# Patient Record
Sex: Male | Born: 1960 | Race: Black or African American | Hispanic: No | Smoking: Current every day smoker
Health system: Southern US, Community
[De-identification: ages and names within clinical notes are randomized; demographics above are authoritative.]

## PROBLEM LIST (undated history)

## (undated) DIAGNOSIS — I10 Essential (primary) hypertension: Secondary | ICD-10-CM

## (undated) HISTORY — PX: BRAIN SURGERY: SHX531

## (undated) NOTE — *Deleted (*Deleted)
PROGRESS NOTE  Gerald Morgan  DOB: [birth year]  PCP: [name], NP ZOX:[id number]  DOA: 03/05/2020  LOS: 0 days   Chief Complaint  Patient presents with  . Code Stroke   *** Brief narrative: CAP MASSI is a 65 y.o. 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 Patient was brought to ED from home by EMS on 11/19 as a code stroke for acute onset right-sided weakness and altered mental status. EMS noted his blood pressure to be severely elevated to 290.  Oxygen saturation 94%.  On arrival to the ED, blood pressure was 268/151, patient was densely hemiplegic on the right and was nonverbal. Stat CT head showed a massive acute left hemispheric bleed with the anteroinferior tip near the MCA.  There was intraventricular extension of blood and mass-effect with initial stasis with midline shift and enlarged ventricles. CTA head and neck negative for aneurysm. Patient was seen by neurology and neurosurgery. After discussion with consultants, family made a decision of comfort care.  Subjective: Patient was seen and examined *** Chart reviewed Blood pressure elevated to 196/121 this morning  Assessment/Plan: 21 year old male with acute left hemispheric ICH with mass effect, left to right midline shift and intraventricular extension -Currently on comfort care status per family's decision -As needed IV Ativan, IV morphine available. -DNR status.  Mobility: *** Code Status:   Code Status: DNR *** Nutritional status: Body mass index is 19.05 kg/m.     Diet Order            Diet NPO time specified  Diet effective now                *** DVT prophylaxis:    Antimicrobials: *** Fluid: *** Consultants: *** Family Communication: ***  Status is: Observation  {Observation:23811}  Dispo:  Patient From:  Home  Planned Disposition: Home  Expected discharge date: 03/06/20  Medically stable for discharge: No   Infusions:    Scheduled Meds: . sodium chloride   Intravenous Once  . sodium chloride flush  3 mL Intravenous Once    Antimicrobials: Anti-infectives (From admission, onward)   None      PRN meds: acetaminophen **OR** acetaminophen, hydrALAZINE, labetalol, LORazepam **OR** LORazepam **OR** LORazepam, morphine injection, morphine CONCENTRATE **OR** morphine CONCENTRATE   Objective: Vitals:   03/06/20 0610 03/06/20 0739  BP: (!) 158/98 (!) 196/121  Pulse: 83 89  Resp: 20   Temp: 98.6 F (37 C) 98.2 F (36.8 C)  SpO2: 99% 100%    Intake/Output Summary (Last 24 hours) at 03/06/2020 0742 Last data filed at 03/06/2020 0600 Gross per 24 hour  Intake 409.9 ml  Output 630 ml  Net -220.1 ml   Filed Weights   03/05/20 1732  Weight: 63.7 kg   Weight change:  Body mass index is 19.05 kg/m.   Physical Exam: General exam: *** Skin: No rashes, lesions or ulcers. HEENT: Atraumatic, normocephalic, no obvious bleeding Lungs: *** CVS: *** GI/Abd *** CNS: *** Psychiatry: *** Extremities: ***  Data Review: I have personally reviewed the laboratory data and studies available.  Recent Labs  Lab 03/05/20 1700 03/05/20 1701  WBC  --  13.0*  NEUTROABS  --  11.4*  HGB 17.3* 15.9  HCT 51.0 48.3  MCV  --  91.5  PLT  --  338   Recent Labs  Lab 03/05/20 1700 03/05/20  1701  NA 141 140  K 4.3 4.3  CL 107 106  CO2  --  21*  GLUCOSE 159* 160*  BUN 17 13  CREATININE 1.50* 1.68*  CALCIUM  --  9.5    F/u labs ***  Signed, Lorin Glass, MD [facility] 03/06/2020

---

## 2002-03-15 ENCOUNTER — Emergency Department (HOSPITAL_COMMUNITY): Admission: EM | Admit: 2002-03-15 | Discharge: 2002-03-15 | Payer: Self-pay | Admitting: Emergency Medicine

## 2005-10-02 ENCOUNTER — Emergency Department (HOSPITAL_COMMUNITY): Admission: EM | Admit: 2005-10-02 | Discharge: 2005-10-02 | Payer: Self-pay | Admitting: Emergency Medicine

## 2008-01-10 ENCOUNTER — Encounter (INDEPENDENT_AMBULATORY_CARE_PROVIDER_SITE_OTHER): Payer: Self-pay | Admitting: Surgery

## 2008-01-10 ENCOUNTER — Inpatient Hospital Stay (HOSPITAL_COMMUNITY): Admission: EM | Admit: 2008-01-10 | Discharge: 2008-01-13 | Payer: Self-pay | Admitting: Emergency Medicine

## 2010-08-30 NOTE — H&P (Signed)
NAMEDESHAWN, WITTY NO.:  [id number]   MEDICAL RECORD NO.:  [id number]          PATIENT TYPE:  EMS   LOCATION:  ED                           FACILITY:  [facility]   PHYSICIAN:  Sandria Bales. Ezzard Standing, M.D.  DATE OF BIRTH:  07-05-1960   DATE OF ADMISSION:  01/10/2008  DATE OF DISCHARGE:                              HISTORY & PHYSICAL   Date of admission ??   REASON FOR REFERRAL:  Appendicitis.   HISTORY OF ILLNESS:  This is a 50 year old black male with no primary  doctor, who has had abdominal pain starting Wednesday, January 08, 2008.   He did not feel sick enough yesterday but felt worse today and has  vomited and came to the emergency room for further evaluation.   He denies any history of peptic ulcer disease, liver disease, pancreatic  disease, or colon disease.  He has had no prior abdominal surgery.   He had a CT scan, which showed a retrocecal appendix with a diameter of  17 mm suggesting acute appendicitis.   PAST MEDICAL HISTORY:  He has no allergies.   He is on no medications.   REVIEW OF SYSTEMS:  NEUROLOGIC:  No seizure or loss of consciousness.  PULMONARY:  Smokes about a half a pack of cigarettes a day and knows  this is bad for his health.  CARDIAC:  He has no heart disease, chest pain, or hypertension.  GASTROINTESTINAL:  See History of Present Illness.  UROLOGIC:  No kidney stones or kidney infection.  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  MUSCULOSKELETAL:  He has had a fracture of his  left leg in the past.   Both his parents are in the room with him, he is unemployed but lives by  himself, worked Environmental manager 2 or 3 months ago.   PHYSICAL EXAMINATION:  VITAL SIGNS:  His temperature is 98.6, his pulse  is 87, respirations 18, blood pressure 144/105.  GENERAL:  He is a thin, otherwise well-nourished-looking, black male,  alert and cooperative on physical exam.  HEENT:  Unremarkable.  NECK:  Supple.  I felt no masses or thyromegaly.  LUNGS:  Clear to auscultation with symmetric breath sounds.  HEART:  Regular rate and rhythm without murmur or rub.  ABDOMEN:  It shows some mild distention.  He has some right-sided and  right flank tenderness, though it is not severe.  He does not have any  guarding.  PELVIS:  Stable.  EXTREMITIES:  He has good strength in the upper and lower extremities.  NEUROLOGICAL:  Grossly intact.   Labs that are part of the chart show a hemoglobin of 16, hematocrit of  48, white blood cell count of 19,400.  He has 80% neutrophils.  His  sodium is 137, potassium 3.8, chloride of 101, CO2 of 24, glucose of  116, creatinine of 1.1.   IMPRESSION:  1. Acute appendicitis.  Discussed with the patient and his family      about proceeding with appendectomy, to try to  do this      laparoscopically, there was a chance of open surgery.  The risks of      surgery includes bleeding, infection, which I think he already has,      and the possibility of an open surgery bowel resection.  2. Smokes cigarettes, knows this is bad for his health.  3. 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  4. Elevated BP - related to acute disease vs. chronic condition ??  5. Encouraged him to find a primary care physician that he can call      for problems and to follow up his blood pressure.      Sandria Bales. Ezzard Standing, M.D.  Electronically Signed     DHN/MEDQ  D:  01/10/2008  T:  01/10/2008  Job:  [id number]

## 2010-08-30 NOTE — Discharge Summary (Signed)
NAMEMARGUES, FILIPPINI NO.:  [id number]   MEDICAL RECORD NO.:  [id number]          PATIENT TYPE:  INP   LOCATION:  1338                         FACILITY:  [facility]   PHYSICIAN:  Sandria Bales. Ezzard Standing, M.D.  DATE OF BIRTH:  May 10, 1960   DATE OF ADMISSION:  01/10/2008  DATE OF DISCHARGE:  01/13/2008                               DISCHARGE SUMMARY   Date of discharge ??   DISCHARGE DIAGNOSES:  1. Appendicitis, retrocecal with abscess.  2. Borderline hypertension.  Needs primary care physician.   OPERATIONS PERFORMED:  On January 10, 2008, the patient had a  laparoscopic appendectomy.   HISTORY OF ILLNESS:  This is a 50 year old black male who has no primary  medical doctor, who had abdominal pain beginning January 08, 2008.  He  presented to the [facility] Emergency Room where he had a CT scan which  showed a retrocecal appendix with a diameter of the appendix being up to  17 mm suggesting acute appendicitis.   He was taken to the operating room and underwent a laparoscopic  appendectomy.  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   Postoperatively, he did well.  By the second postop day, he had vomited  after a milkshake.  He had some mild distention.  I checked a CBC on him  on the third day and his white blood count was 7500. his temperature was  98.5, and he was ready for discharge.  He had been given cefoxitin as an  antibiotic postoperatively.  He had also run some borderline  hypertension and I gave him Lopressor. His blood pressure, however, at  the time of discharge was back in the normal range.   DISCHARGE INSTRUCTIONS:  1. He can have a regular diet.  2. He can shower.  3. He should do no heavy lifting for about 15  pounds for about 1 week.  4. I  gave him Vicodin for pain.  I gave him Augmentin 875 mg for 4      more days for a total of 7 days of antibiotics.   I also encouraged him to get a primary medical doctor to make sure his  blood pressures are followed up and make sure he does not have chronic  hypertension.   DISCHARGE CONDITION:  Good.   PATHOLOGY REPORT:  Pending at time of dictation.      Sandria Bales. Ezzard Standing, M.D.  Electronically Signed     DHN/MEDQ  D:  01/13/2008  T:  01/13/2008  Job:  [id number]

## 2010-08-30 NOTE — Op Note (Signed)
Gerald Morgan, CHEESE NO.:  [id number]   MEDICAL RECORD NO.:  [id number]          PATIENT TYPE:  INP   LOCATION:  0098                         FACILITY:  [facility]   PHYSICIAN:  Sandria Bales. Ezzard Standing, M.D.  DATE OF BIRTH:  [birth year]   DATE OF PROCEDURE:  01/10/2008  DATE OF DISCHARGE:                               OPERATIVE REPORT   Date of surgery ??   PREOPERATIVE DIAGNOSIS:  Appendicitis.   POSTOPERATIVE DIAGNOSIS:  Focally-ruptured appendicitis with essentially  absent right colon.   PROCEDURE:  Laparoscopic appendectomy.   SURGEON:  Sandria Bales. Ezzard Standing, M.D.   FIRST ASSISTANT:  Anselm Pancoast. Zachery Dakins, M.D.   ANESTHESIA:  General endotracheal.   ESTIMATED BLOOD LOSS:  20 mL.   DRAINS LEFT IN:  None.   INDICATION FOR PROCEDURE:  Gerald Morgan is a 50 year old black male who has  had a 2-day history of right-sided abdominal pain and elevated white  blood count with CT scan suggesting a retrocecal appendicitis high in  the right posterior abdomen.   I discussed with the patient's family about proceeding with  appendectomy.  I discussed the indications and potential risks of  surgery including the risk of bleeding and need to resect bowel,  possibility of open surgery, and abnormal location of the appendix.   OPERATIVE NOTE:  The patient was placed in the supine position with his  left arm tucked and righr arm out to the side.  He was given a gram of  cefoxitin at the beginning of the surgical procedure, PAS stockings in  place, a Foley catheter in place.  He was prepped with Betadine solution  and sterilely draped.   I did a time-out, identifying the patient and procedure.   An infraumbilical incision was made with sharp dissection and carried  down to the abdominal cavity.  A 0-degrees 10-mm laparoscope was  inserted through a 12-mm Hasson trocar.  I placed 3 additional trocars,  a 12-mm in the right lower quadrant, a 5-mm in sort of the midepigastric  and a  5-mm in the right upper quadrant.  Exploration carried out  laparoscopically.  The right and left lobes of the liver were  unremarkable.  The stomach was unremarkable.  The gallbladder that I  could see was unremarkable.  He really had almost no right colon.  His  right colon had barely gone down below the liver.  He actually had a  small bowel, terminal ileum sort of fused along what would be a right  colonic gutter.   I had to mobilize what there was of the right colon actually up to  the  transverse colon.  I took the adhesions down in to the edge of the  liver, up to the edge of the gallbladder.   After mobilizing the cecum, I was able to get a purchase under the  appendix.  The appendix actually was dilated and focally ruptured with  an abscess, which I aspirated out, so there was purulence, which would  be a wound classification 4.   I took down the attachments of the appendix to the  cecum.  I took down  what I thought was the mesentery of the appendix with a vessel with the  Harmonic Scalpel.  I got to the base.  Because it was actually very  broad, I used an Endo GIA-45 stapler blue load, fired across.  I took  the entire appendix out and sent it to pathology.  Actually, Dr.  Zachery Dakins, who assisted with the operation, inspected the appendix on  the back table.   I then reinspected the cecum.  I could see no injury to the bowel or  cecum.  I reinspected the staple line.  I irrigated out the right  colonic gutter with about 2 L of saline.   The trocars were then removed in turn, the umbilical port closed with a  0 Vicryl suture, the skin closed with a 5-0 Monocryl suture, painted  with tincture of Benzoin and steri-stripped.  The sponge and needle  count were correct at the end of the case.   The patient tolerated the procedure well, was transported to the  recovery room in good condition.      Sandria Bales. Ezzard Standing, M.D.  Electronically Signed     DHN/MEDQ  D:   01/10/2008  T:  01/11/2008  Job:  [id number]

## 2011-01-16 LAB — DIFFERENTIAL
Basophils Absolute: 0
Basophils Absolute: 0
Basophils Relative: 0
Basophils Relative: 0
Lymphocytes Relative: 4 — ABNORMAL LOW
Monocytes Absolute: 0.9
Neutro Abs: 16.8 — ABNORMAL HIGH
Neutro Abs: 5.4
Neutrophils Relative %: 72
Neutrophils Relative %: 87 — ABNORMAL HIGH

## 2011-01-16 LAB — COMPREHENSIVE METABOLIC PANEL
Albumin: 4.2
Alkaline Phosphatase: 70
BUN: 7
Chloride: 101
Creatinine, Ser: 1.1
Glucose, Bld: 116 — ABNORMAL HIGH
Potassium: 3.8
Total Bilirubin: 1.2

## 2011-01-16 LAB — CBC
HCT: 48.8
Hemoglobin: 13.8
Hemoglobin: 16.4
MCHC: 32.9
MCV: 92.3
Platelets: 269
Platelets: 286
RDW: 13.4
RDW: 13.7
WBC: 19.4 — ABNORMAL HIGH

## 2011-01-16 LAB — URINALYSIS, ROUTINE W REFLEX MICROSCOPIC
Leukocytes, UA: NEGATIVE
Nitrite: NEGATIVE
Specific Gravity, Urine: 1.012
Urobilinogen, UA: 0.2
pH: 6

## 2011-01-16 LAB — URINE MICROSCOPIC-ADD ON

## 2011-09-19 ENCOUNTER — Emergency Department (HOSPITAL_COMMUNITY): Payer: Self-pay

## 2011-09-19 ENCOUNTER — Encounter (HOSPITAL_COMMUNITY): Payer: Self-pay | Admitting: Anesthesiology

## 2011-09-19 ENCOUNTER — Encounter (HOSPITAL_COMMUNITY)
Admission: EM | Disposition: A | Discharge status: Home / Self Care | Payer: Self-pay | Source: Home / Self Care | Attending: Neurological Surgery

## 2011-09-19 ENCOUNTER — Inpatient Hospital Stay (HOSPITAL_COMMUNITY)
Admission: EM | Admit: 2011-09-19 | Discharge: 2011-09-22 | DRG: 025 | Disposition: A | Discharge status: Home / Self Care | Payer: MEDICAID | Attending: Neurological Surgery | Admitting: Neurological Surgery

## 2011-09-19 ENCOUNTER — Inpatient Hospital Stay (HOSPITAL_COMMUNITY): Payer: Self-pay | Admitting: Anesthesiology

## 2011-09-19 ENCOUNTER — Encounter (HOSPITAL_COMMUNITY): Payer: Self-pay | Admitting: *Deleted

## 2011-09-19 DIAGNOSIS — S065X9A Traumatic subdural hemorrhage with loss of consciousness of unspecified duration, initial encounter: Secondary | ICD-10-CM

## 2011-09-19 DIAGNOSIS — Y92009 Unspecified place in unspecified non-institutional (private) residence as the place of occurrence of the external cause: Secondary | ICD-10-CM

## 2011-09-19 DIAGNOSIS — I6200 Nontraumatic subdural hemorrhage, unspecified: Secondary | ICD-10-CM | POA: Diagnosis present

## 2011-09-19 DIAGNOSIS — Y838 Other surgical procedures as the cause of abnormal reaction of the patient, or of later complication, without mention of misadventure at the time of the procedure: Secondary | ICD-10-CM | POA: Diagnosis present

## 2011-09-19 DIAGNOSIS — IMO0002 Reserved for concepts with insufficient information to code with codable children: Principal | ICD-10-CM | POA: Diagnosis present

## 2011-09-19 HISTORY — PX: CRANIOTOMY: SHX93

## 2011-09-19 HISTORY — DX: Essential (primary) hypertension: I10

## 2011-09-19 LAB — COMPREHENSIVE METABOLIC PANEL
AST: 40 U/L — ABNORMAL HIGH (ref 0–37)
Albumin: 3.4 g/dL — ABNORMAL LOW (ref 3.5–5.2)
Alkaline Phosphatase: 81 U/L (ref 39–117)
Chloride: 100 mEq/L (ref 96–112)
Potassium: 4 mEq/L (ref 3.5–5.1)
Total Bilirubin: 0.1 mg/dL — ABNORMAL LOW (ref 0.3–1.2)

## 2011-09-19 LAB — CBC
Hemoglobin: 13 g/dL (ref 13.0–17.0)
MCHC: 33.9 g/dL (ref 30.0–36.0)
RDW: 13.2 % (ref 11.5–15.5)
WBC: 10.6 10*3/uL — ABNORMAL HIGH (ref 4.0–10.5)

## 2011-09-19 LAB — DIFFERENTIAL
Basophils Absolute: 0 10*3/uL (ref 0.0–0.1)
Basophils Relative: 0 % (ref 0–1)
Lymphocytes Relative: 11 % — ABNORMAL LOW (ref 12–46)
Monocytes Relative: 10 % (ref 3–12)
Neutro Abs: 8.4 10*3/uL — ABNORMAL HIGH (ref 1.7–7.7)
Neutrophils Relative %: 79 % — ABNORMAL HIGH (ref 43–77)

## 2011-09-19 SURGERY — CRANIOTOMY HEMATOMA EVACUATION EPIDURAL
Anesthesia: General | Site: Head | Laterality: Left | Wound class: Clean

## 2011-09-19 MED ORDER — HEMOSTATIC AGENTS (NO CHARGE) OPTIME
TOPICAL | Status: DC | PRN
  Administered 2011-09-19: 1 via TOPICAL

## 2011-09-19 MED ORDER — GLYCOPYRROLATE 0.2 MG/ML IJ SOLN
INTRAMUSCULAR | Status: DC | PRN
  Administered 2011-09-19: 0.2 mg via INTRAVENOUS

## 2011-09-19 MED ORDER — LABETALOL HCL 5 MG/ML IV SOLN
5.0000 mg | INTRAVENOUS | Status: AC | PRN
  Administered 2011-09-19 (×4): 5 mg via INTRAVENOUS

## 2011-09-19 MED ORDER — SODIUM CHLORIDE 0.9 % IJ SOLN
3.0000 mL | Freq: Two times a day (BID) | INTRAMUSCULAR | Status: DC
  Administered 2011-09-19 – 2011-09-22 (×5): 3 mL via INTRAVENOUS

## 2011-09-19 MED ORDER — SODIUM CHLORIDE 0.9 % IV SOLN: 250.0000 mL | INTRAVENOUS | Status: DC

## 2011-09-19 MED ORDER — ONDANSETRON HCL 4 MG/2ML IJ SOLN
INTRAMUSCULAR | Status: DC | PRN
  Administered 2011-09-19: 4 mg via INTRAVENOUS

## 2011-09-19 MED ORDER — SODIUM CHLORIDE 0.9 % IV SOLN
INTRAVENOUS | Status: DC | PRN
  Administered 2011-09-19: 15:00:00 via INTRAVENOUS

## 2011-09-19 MED ORDER — ALUM & MAG HYDROXIDE-SIMETH 200-200-20 MG/5ML PO SUSP: 30.0000 mL | Freq: Four times a day (QID) | ORAL | Status: DC | PRN

## 2011-09-19 MED ORDER — ARTIFICIAL TEARS OP OINT
TOPICAL_OINTMENT | OPHTHALMIC | Status: DC | PRN
  Administered 2011-09-19: 1 via OPHTHALMIC

## 2011-09-19 MED ORDER — FENTANYL CITRATE 0.05 MG/ML IJ SOLN
25.0000 ug | INTRAMUSCULAR | Status: DC | PRN
  Administered 2011-09-21: 25 ug via INTRAVENOUS
  Filled 2011-09-19: qty 2

## 2011-09-19 MED ORDER — SODIUM CHLORIDE 0.9 % IV SOLN
INTRAVENOUS | Status: AC
  Filled 2011-09-19: qty 500

## 2011-09-19 MED ORDER — ROCURONIUM BROMIDE 100 MG/10ML IV SOLN
INTRAVENOUS | Status: DC | PRN
  Administered 2011-09-19: 50 mg via INTRAVENOUS

## 2011-09-19 MED ORDER — ONDANSETRON HCL 4 MG/2ML IJ SOLN: 4.0000 mg | Freq: Four times a day (QID) | INTRAMUSCULAR | Status: DC | PRN

## 2011-09-19 MED ORDER — SUCCINYLCHOLINE CHLORIDE 20 MG/ML IJ SOLN
INTRAMUSCULAR | Status: DC | PRN
  Administered 2011-09-19: 100 mg via INTRAVENOUS

## 2011-09-19 MED ORDER — ACETAMINOPHEN 325 MG PO TABS
650.0000 mg | ORAL_TABLET | ORAL | Status: DC | PRN
  Administered 2011-09-21 – 2011-09-22 (×4): 650 mg via ORAL
  Filled 2011-09-19 (×4): qty 2

## 2011-09-19 MED ORDER — ACETAMINOPHEN 650 MG RE SUPP: 650.0000 mg | RECTAL | Status: DC | PRN

## 2011-09-19 MED ORDER — LABETALOL HCL 5 MG/ML IV SOLN
INTRAVENOUS | Status: AC
  Administered 2011-09-19: 5 mg via INTRAVENOUS
  Filled 2011-09-19: qty 4

## 2011-09-19 MED ORDER — CEFAZOLIN SODIUM-DEXTROSE 2-3 GM-% IV SOLR
INTRAVENOUS | Status: AC
  Administered 2011-09-19: 2 g via INTRAVENOUS
  Filled 2011-09-19: qty 50

## 2011-09-19 MED ORDER — SODIUM CHLORIDE 0.9 % IR SOLN
Status: DC | PRN
  Administered 2011-09-19: 18:00:00

## 2011-09-19 MED ORDER — BACITRACIN 50000 UNITS IM SOLR
INTRAMUSCULAR | Status: AC
  Filled 2011-09-19: qty 1

## 2011-09-19 MED ORDER — PHENOL 1.4 % MT LIQD: 1.0000 | OROMUCOSAL | Status: DC | PRN

## 2011-09-19 MED ORDER — MENTHOL 3 MG MT LOZG: 1.0000 | LOZENGE | OROMUCOSAL | Status: DC | PRN

## 2011-09-19 MED ORDER — THIAMINE HCL 100 MG/ML IJ SOLN
Freq: Once | INTRAVENOUS | Status: DC
  Filled 2011-09-19: qty 1000

## 2011-09-19 MED ORDER — THROMBIN 20000 UNITS EX KIT
PACK | CUTANEOUS | Status: DC | PRN
  Administered 2011-09-19: 18:00:00 via TOPICAL

## 2011-09-19 MED ORDER — LISINOPRIL 10 MG PO TABS
10.0000 mg | ORAL_TABLET | Freq: Every day | ORAL | Status: DC
  Administered 2011-09-19 – 2011-09-22 (×4): 10 mg via ORAL
  Filled 2011-09-19 (×4): qty 1

## 2011-09-19 MED ORDER — 0.9 % SODIUM CHLORIDE (POUR BTL) OPTIME
TOPICAL | Status: DC | PRN
  Administered 2011-09-19 (×2): 1000 mL

## 2011-09-19 MED ORDER — NEOSTIGMINE METHYLSULFATE 1 MG/ML IJ SOLN
INTRAMUSCULAR | Status: DC | PRN
  Administered 2011-09-19: 3 mg via INTRAVENOUS

## 2011-09-19 MED ORDER — MORPHINE SULFATE 2 MG/ML IJ SOLN
1.0000 mg | INTRAMUSCULAR | Status: DC | PRN
  Administered 2011-09-20: 4 mg via INTRAVENOUS
  Administered 2011-09-20: 2 mg via INTRAVENOUS
  Administered 2011-09-20: 4 mg via INTRAVENOUS
  Administered 2011-09-22: 2 mg via INTRAVENOUS
  Filled 2011-09-19: qty 1
  Filled 2011-09-19 (×2): qty 2
  Filled 2011-09-19: qty 1

## 2011-09-19 MED ORDER — LABETALOL HCL 5 MG/ML IV SOLN
INTRAVENOUS | Status: DC | PRN
  Administered 2011-09-19: 5 mg via INTRAVENOUS
  Administered 2011-09-19: 10 mg via INTRAVENOUS
  Administered 2011-09-19 (×3): 5 mg via INTRAVENOUS

## 2011-09-19 MED ORDER — ONDANSETRON HCL 4 MG/2ML IJ SOLN: 4.0000 mg | INTRAMUSCULAR | Status: DC | PRN

## 2011-09-19 MED ORDER — LIDOCAINE HCL (CARDIAC) 20 MG/ML IV SOLN
INTRAVENOUS | Status: DC | PRN
  Administered 2011-09-19: 80 mg via INTRAVENOUS
  Administered 2011-09-19: 50 mg via INTRAVENOUS

## 2011-09-19 MED ORDER — PNEUMOCOCCAL VAC POLYVALENT 25 MCG/0.5ML IJ INJ: 0.5000 mL | INJECTION | INTRAMUSCULAR | Status: DC

## 2011-09-19 MED ORDER — CEFAZOLIN SODIUM 1-5 GM-% IV SOLN
1.0000 g | Freq: Three times a day (TID) | INTRAVENOUS | Status: AC
  Administered 2011-09-19 – 2011-09-20 (×2): 1 g via INTRAVENOUS
  Filled 2011-09-19 (×2): qty 50

## 2011-09-19 MED ORDER — SODIUM CHLORIDE 0.9 % IJ SOLN: 3.0000 mL | INTRAMUSCULAR | Status: DC | PRN

## 2011-09-19 MED ORDER — FENTANYL CITRATE 0.05 MG/ML IJ SOLN
INTRAMUSCULAR | Status: DC | PRN
  Administered 2011-09-19 (×5): 50 ug via INTRAVENOUS

## 2011-09-19 MED ORDER — PROPOFOL 10 MG/ML IV EMUL
INTRAVENOUS | Status: DC | PRN
  Administered 2011-09-19: 180 mg via INTRAVENOUS

## 2011-09-19 SURGICAL SUPPLY — 73 items
0.5% BUPIVACAINE 30ML IMPLANT
1% XYLOCAINE W/EPI 1:100,000 20ML IMPLANT
BAG DECANTER FOR FLEXI CONT (MISCELLANEOUS) ×3 IMPLANT
BANDAGE GAUZE ELAST BULKY 4 IN (GAUZE/BANDAGES/DRESSINGS) ×3 IMPLANT
BIT DRILL WIRE PASS 1.3MM (BIT) IMPLANT
BRUSH SCRUB EZ PLAIN DRY (MISCELLANEOUS) ×3 IMPLANT
BUR ACORN 6.0 (BURR) IMPLANT
BUR ADDG 1.1 (BURR) IMPLANT
BUR ROUTER D-58 CRANI (BURR) ×3 IMPLANT
CANISTER SUCTION 2500CC (MISCELLANEOUS) ×6 IMPLANT
CLIP TI MEDIUM 6 (CLIP) IMPLANT
CLOTH BEACON ORANGE TIMEOUT ST (SAFETY) ×3 IMPLANT
CONT SPEC 4OZ CLIKSEAL STRL BL (MISCELLANEOUS) ×3 IMPLANT
CORDS BIPOLAR (ELECTRODE) ×3 IMPLANT
DECANTER SPIKE VIAL GLASS SM (MISCELLANEOUS) IMPLANT
DRAIN CHANNEL 10M FLAT 3/4 FLT (DRAIN) IMPLANT
DRAIN JACKSON PRATT 10MM FLAT (MISCELLANEOUS) ×3 IMPLANT
DRAIN PENROSE 1/2X12 LTX STRL (WOUND CARE) IMPLANT
DRAPE SURG IRRIG POUCH 19X23 (DRAPES) ×3 IMPLANT
DRAPE WARM FLUID 44X44 (DRAPE) ×3 IMPLANT
DRILL WIRE PASS 1.3MM (BIT)
DRSG ADAPTIC 3X8 NADH LF (GAUZE/BANDAGES/DRESSINGS) IMPLANT
DRSG PAD ABDOMINAL 8X10 ST (GAUZE/BANDAGES/DRESSINGS) IMPLANT
DURAPREP 6ML APPLICATOR 50/CS (WOUND CARE) IMPLANT
ELECT CAUTERY BLADE 6.4 (BLADE) IMPLANT
ELECT REM PT RETURN 9FT ADLT (ELECTROSURGICAL) ×3
ELECTRODE REM PT RTRN 9FT ADLT (ELECTROSURGICAL) ×2 IMPLANT
EVACUATOR SILICONE 100CC (DRAIN) ×3 IMPLANT
GAUZE SPONGE 4X4 16PLY XRAY LF (GAUZE/BANDAGES/DRESSINGS) IMPLANT
GLOVE BIO SURGEON STRL SZ7.5 (GLOVE) IMPLANT
GLOVE BIOGEL PI IND STRL 7.5 (GLOVE) ×2 IMPLANT
GLOVE BIOGEL PI IND STRL 8.5 (GLOVE) ×2 IMPLANT
GLOVE BIOGEL PI INDICATOR 7.5 (GLOVE) ×1
GLOVE BIOGEL PI INDICATOR 8.5 (GLOVE) ×1
GLOVE ECLIPSE 7.5 STRL STRAW (GLOVE) ×6 IMPLANT
GLOVE ECLIPSE 8.5 STRL (GLOVE) ×3 IMPLANT
GLOVE EXAM NITRILE LRG STRL (GLOVE) IMPLANT
GLOVE EXAM NITRILE MD LF STRL (GLOVE) ×6 IMPLANT
GLOVE EXAM NITRILE XL STR (GLOVE) IMPLANT
GLOVE EXAM NITRILE XS STR PU (GLOVE) IMPLANT
GOWN BRE IMP SLV AUR LG STRL (GOWN DISPOSABLE) IMPLANT
GOWN BRE IMP SLV AUR XL STRL (GOWN DISPOSABLE) ×3 IMPLANT
GOWN STRL REIN 2XL LVL4 (GOWN DISPOSABLE) ×3 IMPLANT
HEMOSTAT SURGICEL 2X14 (HEMOSTASIS) ×3 IMPLANT
HOOK DURA (MISCELLANEOUS) ×3 IMPLANT
KIT BASIN OR (CUSTOM PROCEDURE TRAY) ×3 IMPLANT
KIT ROOM TURNOVER OR (KITS) ×3 IMPLANT
NEEDLE HYPO 22GX1.5 SAFETY (NEEDLE) ×3 IMPLANT
NS IRRIG 1000ML POUR BTL (IV SOLUTION) ×6 IMPLANT
PACK CRANIOTOMY (CUSTOM PROCEDURE TRAY) ×3 IMPLANT
PATTIES SURGICAL .5 X.5 (GAUZE/BANDAGES/DRESSINGS) IMPLANT
PATTIES SURGICAL .5 X3 (DISPOSABLE) IMPLANT
PATTIES SURGICAL 1X1 (DISPOSABLE) IMPLANT
PIN MAYFIELD SKULL DISP (PIN) IMPLANT
PLATE 1.5  2HOLE MED NEURO (Plate) ×3 IMPLANT
PLATE 1.5 2HOLE MED NEURO (Plate) ×6 IMPLANT
SCREW SELF DRILL HT 1.5/4MM (Screw) ×21 IMPLANT
SPECIMEN JAR SMALL (MISCELLANEOUS) IMPLANT
SPONGE GAUZE 4X4 12PLY (GAUZE/BANDAGES/DRESSINGS) ×3 IMPLANT
SPONGE NEURO XRAY DETECT 1X3 (DISPOSABLE) IMPLANT
SPONGE SURGIFOAM ABS GEL 100 (HEMOSTASIS) ×3 IMPLANT
STAPLER SKIN PROX WIDE 3.9 (STAPLE) ×3 IMPLANT
SUT ETHILON 3 0 FSL (SUTURE) ×3 IMPLANT
SUT NURALON 4 0 TR CR/8 (SUTURE) ×6 IMPLANT
SUT VIC AB 2-0 CP2 18 (SUTURE) ×6 IMPLANT
SYR 20ML ECCENTRIC (SYRINGE) ×3 IMPLANT
SYR CONTROL 10ML LL (SYRINGE) ×3 IMPLANT
TOWEL OR 17X24 6PK STRL BLUE (TOWEL DISPOSABLE) IMPLANT
TOWEL OR 17X26 10 PK STRL BLUE (TOWEL DISPOSABLE) ×6 IMPLANT
TRAP SPECIMEN MUCOUS 40CC (MISCELLANEOUS) IMPLANT
TRAY FOLEY CATH 14FRSI W/METER (CATHETERS) ×3 IMPLANT
UNDERPAD 30X30 INCONTINENT (UNDERPADS AND DIAPERS) IMPLANT
WATER STERILE IRR 1000ML POUR (IV SOLUTION) ×3 IMPLANT

## 2011-09-19 NOTE — Op Note (Signed)
Preoperative diagnosis: Recurrent subdural hematoma left frontal parietal Postoperative diagnosis: Postoperative epidural hematoma left frontal parietal Procedure: Evacuation of epidural hematoma via left frontal craniotomy Surgeon: Barnett Abu M.D. Anesthesia: Gen. endotracheal Indications: The patient is a 51 year old right-handed individual who had a subdural hematoma little over a week ago. This was evacuated at [facility] in [place]. He was discharged home. Over the past 4 days he's had an increasing subgaleal fluid collection. He's also begun to feel weak and today he came to the emergency room with weakness in the right side and a CT scan demonstrates the presence of a left frontal collection believed to be a subdural hematoma.  Procedure: Patient was brought to the operating room supine on a stretcher. After the smooth induction of general endotracheal anesthesia his head was turned to the right side on a doughnut head holder and the scalp was prepped with Betadine scrub and solution. The staples were removed from his previously made incision. After draping sterilely the skin was opened with Metzenbaum scissors releasing the subgaleal sutures. A subgaleal hematoma was encountered and this was evacuated. The craniotomy flap was identified and a small titanium plates and screws were identified also. Unfortunately our screwdriver did not fit the cranial plates and screws and therefore the plates were destroyed in an effort to remove the bone flap. Once the flap was removed immediately apparent was a large epidural hematoma. Evacuation of this hematoma yielded a soft pulsatile dura. The area was inspected carefully. There was noted to be a number of tack ups in the periphery of the craniotomy. No central tack ups were noted. After checking carefully for any bleeding points none of an arterial nature were identified. The dura then had several central tack ups placed through the craniotomy  plate and new simple 2 hole plates were used around the periphery for a total of 4 plates to hold up the craniotomy flap area the central tack ups were tied down area the scalp was then reflected into its original position and a 10 mm Jackson-Pratt drain was placed into the subgaleal space. The galea was closed with 2-0 Vicryl in inverted interrupted fashion. The skin was closed with surgical staples. Blood loss for the procedure was less than 100 cc. The patient tolerated the procedure well.

## 2011-09-19 NOTE — Anesthesia Postprocedure Evaluation (Signed)
  Anesthesia Post-op Note  Patient: Gerald Morgan  Procedure(s) Performed: Procedure(s) (LRB): CRANIOTOMY HEMATOMA EVACUATION EPIDURAL (Left)  Patient Location: PACU  Anesthesia Type: General  Level of Consciousness: awake  Airway and Oxygen Therapy: Patient Spontanous Breathing  Post-op Pain: mild  Post-op Assessment: Post-op Vital signs reviewed  Post-op Vital Signs: Reviewed  Complications: No apparent anesthesia complications

## 2011-09-19 NOTE — Progress Notes (Signed)
UR complete 

## 2011-09-19 NOTE — ED Provider Notes (Signed)
History     CSN: [id number]  Arrival date & time 09/19/11  0903   None     Chief Complaint  Patient presents with  . Dizziness  . Fatigue    (Consider location/radiation/quality/duration/timing/severity/associated sxs/prior treatment) Patient is a 51 y.o. male presenting with weakness. The history is provided by the patient and a parent. No language interpreter was used.  Weakness The primary symptoms include headaches, altered mental status, dizziness and speech change. The symptoms began more than 1 week ago. The symptoms are worsening. The neurological symptoms are diffuse. The symptoms occurred following head trauma.  The headache is associated with weakness and loss of balance. The headache is not associated with photophobia.  Dizziness also occurs with weakness.  Additional symptoms include weakness and loss of balance. Additional symptoms do not include photophobia. Medical issues do not include seizures. Workup history includes CT scan.  Pt fell and hit his head a week ago while in [facility] on concrete.  Pt had emergency surgery at [facility] due to "blood clot"   Pt reports this am he feels dizzy and groggy.    No past medical history on file.  No past surgical history on file.  No family history on file.  History  Substance Use Topics  . Smoking status: Not on file  . Smokeless tobacco: Not on file  . Alcohol Use: Not on file      Review of Systems  Eyes: Negative for photophobia.  Neurological: Positive for dizziness, speech change, weakness, headaches and loss of balance.  Psychiatric/Behavioral: Positive for altered mental status.  All other systems reviewed and are negative.    Allergies  Review of patient's allergies indicates no known allergies.  Home Medications   Current Outpatient Rx  Name Route Sig Dispense Refill  . BUTALBITAL-APAP-CAFFEINE 50-325-40 MG PO TABS Oral Take 1 tablet by mouth every 6 (six) hours as needed. For headache    .  LISINOPRIL 10 MG PO TABS Oral Take 10 mg by mouth daily.      BP 136/94  Pulse 73  Temp(Src) 98.5 F (36.9 C) (Oral)  Resp 16  SpO2 100%  Physical Exam  Nursing note and vitals reviewed. Constitutional: He appears well-developed and well-nourished.  HENT:  Head: Normocephalic and atraumatic.  Right Ear: External ear normal.  Left Ear: External ear normal.  Nose: Nose normal.  Mouth/Throat: Oropharynx is clear and moist.  Eyes: Conjunctivae and EOM are normal. Pupils are equal, round, and reactive to light.  Neck: Normal range of motion. Neck supple.  Cardiovascular: Normal rate.   Pulmonary/Chest: Effort normal.  Abdominal: Soft.  Musculoskeletal: Normal range of motion.  Neurological: He is alert. He has normal reflexes. A cranial nerve deficit is present.       Alert, speech seems slow,  Left face head swollen,  Question facial droop vs swelling  Skin: Skin is warm.  Psychiatric: He has a normal mood and affect.    ED Course  Procedures (including critical care time)  Labs Reviewed  CBC - Abnormal; Notable for the following:    WBC 10.6 (*)    RBC 4.12 (*)    HCT 38.4 (*)    Platelets 500 (*)    All other components within normal limits  DIFFERENTIAL - Abnormal; Notable for the following:    Neutrophils Relative 79 (*)    Neutro Abs 8.4 (*)    Lymphocytes Relative 11 (*)    All other components within normal limits  COMPREHENSIVE  METABOLIC PANEL - Abnormal; Notable for the following:    Glucose, Bld 120 (*)    Albumin 3.4 (*)    AST 40 (*)    Total Bilirubin 0.1 (*)    All other components within normal limits   No results found.   1. Subdural hematoma       MDM  Records ordered from [facility]    Date: 09/19/2011  Rate: 63  Rhythm: normal sinus rhythm  QRS Axis: normal  Intervals: normal  ST/T Wave abnormalities: normal  Conduction Disutrbances:none  Narrative Interpretation:   Old EKG Reviewed: none available   Results for orders  placed during the hospital encounter of 09/19/11  CBC      Component Value Range   WBC 10.6 (*) 4.0 - 10.5 (K/uL)   RBC 4.12 (*) 4.22 - 5.81 (MIL/uL)   Hemoglobin 13.0  13.0 - 17.0 (g/dL)   HCT 40.9 (*) 81.1 - 52.0 (%)   MCV 93.2  78.0 - 100.0 (fL)   MCH 31.6  26.0 - 34.0 (pg)   MCHC 33.9  30.0 - 36.0 (g/dL)   RDW 91.4  78.2 - 95.6 (%)   Platelets 500 (*) 150 - 400 (K/uL)  DIFFERENTIAL      Component Value Range   Neutrophils Relative 79 (*) 43 - 77 (%)   Neutro Abs 8.4 (*) 1.7 - 7.7 (K/uL)   Lymphocytes Relative 11 (*) 12 - 46 (%)   Lymphs Abs 1.2  0.7 - 4.0 (K/uL)   Monocytes Relative 10  3 - 12 (%)   Monocytes Absolute 1.0  0.1 - 1.0 (K/uL)   Eosinophils Relative 0  0 - 5 (%)   Eosinophils Absolute 0.0  0.0 - 0.7 (K/uL)   Basophils Relative 0  0 - 1 (%)   Basophils Absolute 0.0  0.0 - 0.1 (K/uL)   Ct Head Wo Contrast  09/19/2011  *RADIOLOGY REPORT*  Clinical Data: Head trauma 1 week ago  CT HEAD WITHOUT CONTRAST  Technique:  Contiguous axial images were obtained from the base of the skull through the vertex without contrast.  Comparison: None.  Findings: There is subdural hematoma in the left frontoparietal lobe measures 1.9 cm thickness by 10.4 cm length.  There is area with high density material posteriorly within hematoma  measures three by 1.4 cm suspicious for active or subacute bleeding.  I cannot exclude  some combination of epidural hematoma due to mass effect on the left hemisphere.  There is at least 9 mm left to right midline shift.  There is significant mass effect on the left anterior and left posterior ventricles.  Small amount of air there is small amount of pneumocephalus anterior aspect of the hematoma.  There is a large scalp hematoma with subcutaneous air and air bubbles within hematoma in the left frontoparietal scalp measures at least 10 x 2.3 cm.  There is probable prior craniotomy in the left frontoparietal lobe.  In axial image 13 there is a intraparenchymal  hematoma in the left frontal lobe measures 1.8 cm. Small amount of blood is noted along the interhemispheric fissure.  No definite acute infarct.  No intraventricular hemorrhage.  IMPRESSION: There is subdural hematoma in the left frontoparietal lobe measures 1.9 cm thickness by 10.4 cm length.  There is area with high density material posteriorly within hematoma  measures three by 1.4 cm suspicious for active or subacute bleeding.  I cannot exclude some combination of epidural hematoma due to mass effect on the left  hemisphere.  There is at least 9 mm left to right midline shift.  There is significant mass effect on the left anterior and left posterior ventricles.  Small amount of air there is small amount of pneumocephalus anterior aspect of the  hematoma.  There is a large scalp hematoma with subcutaneous air and air bubbles within hematoma in the left frontoparietal scalp measures at least 10 x 2.3 cm.  There is probable prior craniotomy in the left frontoparietal lobe.  In axial image 13 there is a intraparenchymal hematoma in the left frontal lobe measures 1.8 cm. Small amount of blood is noted along the interhemispheric fissure.  Critical findings discussed with Dr.Caparossi  Original Report Authenticated By: Natasha Mead, M.D.   [facility] in to see and examine pt.   Call to Neurosurgeon for evaluation   Elson Areas, [place] 09/19/11 1125  Lonia Skinner [place], [place] 09/19/11 1127  Lonia Skinner [place], [place] 09/19/11 1128

## 2011-09-19 NOTE — ED Notes (Signed)
Pt states he woke up this am with dizziness. States he is feeling more tired than usual. Pt had recent head trauma and was admitted to hospital for 4 days, staples still in place. Stroke scale negative. Denies headache at this time.
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
## 2011-09-19 NOTE — Consult Note (Signed)
Is called by Dr. Manfred Arch to review a CT scan on [name]. By history the patient had a craniotomy a little over week ago for a large left frontoparietal subdural hematoma. This was done in [facility] in [place]. He was discharged a few days later and returned [facility] where he's noted a progressively enlarging subgaleal fluid collection. He's become increasingly ataxic. Presented to the emergency room today CT scan demonstrates the presence of a large recurrent subdural hematoma and left frontal and parietal regions. This will require re\re evacuation.  On physical examination at the current time the patient is awake alert oriented there is some mild to moderate right facial droop. There is a mild right upper extremity drift but his lower extremities appear intact.  For other details of the patient's past medical history social history systems review we see a formal history and physical dictated separately.

## 2011-09-19 NOTE — ED Provider Notes (Signed)
Medical screening examination/treatment/procedure(s) were conducted as a shared visit with non-physician practitioner(s) and myself.  I personally evaluated the patient during the encounter  Cheri Guppy, MD 09/19/11 1534

## 2011-09-19 NOTE — Transfer of Care (Signed)
Immediate Anesthesia Transfer of Care Note  Patient: Gerald Morgan  Procedure(s) Performed: Procedure(s) (LRB): CRANIOTOMY HEMATOMA EVACUATION EPIDURAL (Left)  Patient Location: PACU  Anesthesia Type: General  Level of Consciousness: sedated and patient cooperative  Airway & Oxygen Therapy: Patient Spontanous Breathing and Patient connected to nasal cannula oxygen  Post-op Assessment: Report given to PACU RN, Post -op Vital signs reviewed and stable and Patient moving all extremities X 4  Post vital signs: Reviewed and stable  Complications: No apparent anesthesia complications
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
## 2011-09-19 NOTE — ED Notes (Signed)
Floor RN returned called. Not ready for PT at this time. Will call back when ready for Pt to go to the floor.
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
## 2011-09-19 NOTE — ED Notes (Signed)
Floor called. Attempted to give report.

## 2011-09-20 ENCOUNTER — Encounter (HOSPITAL_COMMUNITY): Payer: Self-pay | Admitting: Neurological Surgery

## 2011-09-20 MED ORDER — CEFAZOLIN SODIUM 1-5 GM-% IV SOLN
1.0000 g | Freq: Three times a day (TID) | INTRAVENOUS | Status: DC
  Administered 2011-09-20 – 2011-09-22 (×6): 1 g via INTRAVENOUS
  Filled 2011-09-20 (×8): qty 50

## 2011-09-20 NOTE — Progress Notes (Signed)
Occupational Therapy Note  OT order received and appreciated.  Pt able to demonstrate BADLs and functional transfers at mod I level.  Pt reports that he will have necessary level of assist upon d/c home.  No DME needs.  Pt reports that he has no questions or concerns about returning home.  At this time, pt has no acute OT needs.  Please re-order if pt experiences decline in functional status. Thanks!  09/20/2011 Cipriano Mile OTR/L Pager [phone] Office [phone]

## 2011-09-20 NOTE — Progress Notes (Signed)
Patient ID: Gerald Morgan, male   DOB: 08-Apr-1961, 51 y.o.   MRN: [id number] Alert oriented feels much better than yesterday. No headache minimal scalp pain from surgery.  Examination reveals no evidence of cortical drift drain with moderate output 100 cc overnight. Facial asymmetry markedly improved also.  Plan discontinue arterial line Foley catheter is already out. Ambulate patient. Leave subgaleal drain in place. Observe in intensive care unit today.
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
## 2011-09-21 NOTE — Progress Notes (Signed)
Chaplain Note:  Chaplain visited with pt and pt's family.  Pt was resting in bed, awake, oriented, and in good spirits.  Family was seated at bedside.  Chaplain provided spiritual comfort and support for pt and pt's family.  Both expressed appreciation for chaplain support.  Chaplain will follow up as needed.  09/21/11 1400  Clinical Encounter Type  Visited With Patient  Visit Type Spiritual support  Referral From Other (Comment) (Rasul Decola-referral)  Spiritual Encounters  Spiritual Needs Emotional  Stress Factors  Patient Stress Factors Health changes  Family Stress Factors None identified   Verdie Shire, chaplain resident [phone]

## 2011-09-21 NOTE — Progress Notes (Signed)
Clinical Social Worker received referral from MD for SNF.  However, PT/OT are recommending home.  CSW not to sign on.  Please re consult if additional needs arise.   Angelia Mould, MSW, [place] [phone]

## 2011-09-21 NOTE — Progress Notes (Signed)
Pt not ambulated this morning at scheduled time due to visitors in room with patient.

## 2011-09-21 NOTE — Progress Notes (Signed)
Patient transferred to unit 3000. Room 3606 bed 1, report called and given to [facility], Charity fundraiser. All belongings with patient upon transfer, patient stable upon transfer.

## 2011-09-22 NOTE — Discharge Summary (Signed)
Pt given d/c instructions along with f/u apt to be made with Dr. Danielle Dess in 10 days for staple removal.  Pt and parents verbalized understanding of d/c instructions.  Pt d/c'd home via w/c accompanied by medical staff and family.

## 2011-09-22 NOTE — Discharge Summary (Signed)
  Admitting diagnosis: Postoperative recurrent intracranial hematoma (subdural) Discharge and final diagnosis: Postoperative epidural hematoma left frontal parietal region Major operation: Left frontal craniotomy for evacuation of postoperative hematoma (epidural) Condition on discharge: Improved Hospital course: Patient was admitted from the emergency department having had a craniotomy for a subdural hematoma approximately 12 days ago in [facility] at [facility]. The patient developed progressively worsening headache and weakness on his right side. He was noted to be ataxic. A CT scan performed at in the emergency department demonstrated the presence of a large epidural hematoma. He was taken to the operating room or in her went repeat craniotomy in the same area and the hematoma was evacuated.  Postoperatively the patient has done well he denies any significant problems with pain he is ambulatory and his strength has recovered on the right side. A subgaleal drain that was placed at the time of surgery is removed. His incision is clean and dry. He's been advised as to postoperative activities. He'll be seen in little over a week's time for staple removal. No prescriptions are written.
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
## 2011-09-29 ENCOUNTER — Other Ambulatory Visit (HOSPITAL_COMMUNITY): Payer: Self-pay | Admitting: Neurological Surgery

## 2011-09-29 DIAGNOSIS — S065X9A Traumatic subdural hemorrhage with loss of consciousness of unspecified duration, initial encounter: Secondary | ICD-10-CM

## 2011-10-23 ENCOUNTER — Other Ambulatory Visit (HOSPITAL_COMMUNITY): Payer: Self-pay

## 2011-10-27 ENCOUNTER — Other Ambulatory Visit (HOSPITAL_COMMUNITY): Payer: Self-pay

## 2011-11-06 ENCOUNTER — Ambulatory Visit (HOSPITAL_COMMUNITY)
Admission: RE | Admit: 2011-11-06 | Discharge: 2011-11-06 | Disposition: A | Discharge status: Home / Self Care | Payer: Self-pay | Source: Ambulatory Visit | Attending: Neurological Surgery | Admitting: Neurological Surgery

## 2011-11-06 DIAGNOSIS — R51 Headache: Secondary | ICD-10-CM | POA: Insufficient documentation

## 2011-11-06 DIAGNOSIS — S065X9A Traumatic subdural hemorrhage with loss of consciousness of unspecified duration, initial encounter: Secondary | ICD-10-CM

## 2011-12-20 ENCOUNTER — Emergency Department (HOSPITAL_COMMUNITY)
Admission: EM | Admit: 2011-12-20 | Discharge: 2011-12-20 | Disposition: A | Discharge status: Home / Self Care | Payer: Self-pay | Attending: Emergency Medicine | Admitting: Emergency Medicine

## 2011-12-20 ENCOUNTER — Encounter (HOSPITAL_COMMUNITY): Payer: Self-pay | Admitting: Emergency Medicine

## 2011-12-20 DIAGNOSIS — F17200 Nicotine dependence, unspecified, uncomplicated: Secondary | ICD-10-CM | POA: Insufficient documentation

## 2011-12-20 DIAGNOSIS — R1013 Epigastric pain: Secondary | ICD-10-CM | POA: Insufficient documentation

## 2011-12-20 DIAGNOSIS — I10 Essential (primary) hypertension: Secondary | ICD-10-CM | POA: Insufficient documentation

## 2011-12-20 DIAGNOSIS — R112 Nausea with vomiting, unspecified: Secondary | ICD-10-CM | POA: Insufficient documentation

## 2011-12-20 DIAGNOSIS — K529 Noninfective gastroenteritis and colitis, unspecified: Secondary | ICD-10-CM

## 2011-12-20 LAB — CBC WITH DIFFERENTIAL/PLATELET
Basophils Absolute: 0 10*3/uL (ref 0.0–0.1)
Eosinophils Absolute: 0.2 10*3/uL (ref 0.0–0.7)
Eosinophils Relative: 2 % (ref 0–5)
MCH: 31.5 pg (ref 26.0–34.0)
MCHC: 34.7 g/dL (ref 30.0–36.0)
MCV: 90.8 fL (ref 78.0–100.0)
Platelets: 271 10*3/uL (ref 150–400)
RDW: 14.4 % (ref 11.5–15.5)
WBC: 9 10*3/uL (ref 4.0–10.5)

## 2011-12-20 LAB — URINALYSIS, ROUTINE W REFLEX MICROSCOPIC
Glucose, UA: NEGATIVE mg/dL
Ketones, ur: 40 mg/dL — AB
Leukocytes, UA: NEGATIVE
Protein, ur: 100 mg/dL — AB

## 2011-12-20 LAB — HEPATIC FUNCTION PANEL
ALT: 14 U/L (ref 0–53)
AST: 21 U/L (ref 0–37)
Bilirubin, Direct: 0.1 mg/dL (ref 0.0–0.3)
Total Bilirubin: 0.5 mg/dL (ref 0.3–1.2)

## 2011-12-20 LAB — URINE MICROSCOPIC-ADD ON

## 2011-12-20 LAB — BASIC METABOLIC PANEL
CO2: 27 mEq/L (ref 19–32)
Calcium: 9.6 mg/dL (ref 8.4–10.5)
Creatinine, Ser: 0.83 mg/dL (ref 0.50–1.35)
Glucose, Bld: 116 mg/dL — ABNORMAL HIGH (ref 70–99)

## 2011-12-20 MED ORDER — SODIUM CHLORIDE 0.9 % IV BOLUS (SEPSIS)
1000.0000 mL | Freq: Once | INTRAVENOUS | Status: AC
  Administered 2011-12-20: 1000 mL via INTRAVENOUS

## 2011-12-20 MED ORDER — HYDROCODONE-ACETAMINOPHEN 5-500 MG PO TABS: 1.0000 | ORAL_TABLET | Freq: Four times a day (QID) | ORAL | Status: AC | PRN

## 2011-12-20 MED ORDER — MORPHINE SULFATE 4 MG/ML IJ SOLN
4.0000 mg | Freq: Once | INTRAMUSCULAR | Status: AC
  Administered 2011-12-20: 4 mg via INTRAVENOUS
  Filled 2011-12-20: qty 1

## 2011-12-20 MED ORDER — ONDANSETRON HCL 4 MG/2ML IJ SOLN
4.0000 mg | Freq: Once | INTRAMUSCULAR | Status: AC
  Administered 2011-12-20: 4 mg via INTRAVENOUS
  Filled 2011-12-20: qty 2

## 2011-12-20 MED ORDER — PROMETHAZINE HCL 25 MG PO TABS: 25.0000 mg | ORAL_TABLET | Freq: Four times a day (QID) | ORAL | Status: DC | PRN

## 2011-12-20 NOTE — ED Notes (Signed)
Pt. Reports abdominal pain x2 days with N/V. Abdomin tender to palpitation. Denies diarrhea or fever.

## 2011-12-20 NOTE — ED Notes (Signed)
Prescriptions x2 given with discharge instructions.  

## 2011-12-20 NOTE — ED Notes (Signed)
PT. REPORTS MID ABDOMINAL PAIN WITH VOMITTING  FOR 2 DAYS , DENIES DIARRHEA  , FEVER OR CHILLS.

## 2011-12-20 NOTE — ED Provider Notes (Signed)
History     CSN: [id number]  Arrival date & time 12/20/11  0028   First MD Initiated Contact with Patient 12/20/11 [phone]      Chief Complaint  Patient presents with  . Abdominal Pain    (Consider location/radiation/quality/duration/timing/severity/associated sxs/prior treatment) HPI Comments: Patient presents with two day history of worsening upper abd pain, nausea, and vomiting.  He denies fevers or chills.  There are no ill contacts.  All emesis is non-bloody.  Patient is a 51 y.o. male presenting with abdominal pain. The history is provided by the patient.  Abdominal Pain The primary symptoms of the illness include abdominal pain, fatigue, nausea and vomiting. The primary symptoms of the illness do not include fever, diarrhea or dysuria. The current episode started 2 days ago. The onset of the illness was gradual. The problem has been gradually worsening.  The patient has not had a change in bowel habit. Symptoms associated with the illness do not include chills.    Past Medical History  Diagnosis Date  . Hypertension     Past Surgical History  Procedure Date  . Brain surgery   . Craniotomy 09/19/2011    Procedure: CRANIOTOMY HEMATOMA EVACUATION EPIDURAL;  Surgeon: Barnett Abu, MD;  Location: [facility];  Service: Neurosurgery;  Laterality: Left;  Redo craniotomy for evacuation of epidural hematoma    No family history on file.  History  Substance Use Topics  . Smoking status: Current Everyday Smoker -- 0.5 packs/day    Types: Cigarettes  . Smokeless tobacco: Never Used  . Alcohol Use: Yes      Review of Systems  Constitutional: Positive for fatigue. Negative for fever and chills.  Gastrointestinal: Positive for nausea, vomiting and abdominal pain. Negative for diarrhea.  Genitourinary: Negative for dysuria.  All other systems reviewed and are negative.    Allergies  Review of patient's allergies indicates no known allergies.  Home Medications   Current  Outpatient Rx  Name Route Sig Dispense Refill  . ACETAMINOPHEN 500 MG PO TABS Oral Take 500 mg by mouth every 6 (six) hours as needed. For pain    . BUTALBITAL-APAP-CAFFEINE 50-325-40 MG PO TABS Oral Take 1 tablet by mouth every 6 (six) hours as needed. For headache    . LISINOPRIL 10 MG PO TABS Oral Take 10 mg by mouth daily.      BP 188/147  Pulse 83  Temp 98.1 F (36.7 C)  Resp 18  SpO2 98%  Physical Exam  Nursing note and vitals reviewed. Constitutional: He is oriented to person, place, and time. He appears well-developed and well-nourished. No distress.  HENT:  Head: Normocephalic and atraumatic.  Mouth/Throat: Oropharynx is clear and moist.  Neck: Normal range of motion. Neck supple.  Cardiovascular: Normal rate and regular rhythm.   No murmur heard. Pulmonary/Chest: Effort normal and breath sounds normal. No respiratory distress. He has no wheezes.  Abdominal: Soft. Bowel sounds are normal. He exhibits no distension.       There is mild ttp in the epigastrium without rebound or guarding.    Musculoskeletal: Normal range of motion. He exhibits no edema.  Lymphadenopathy:    He has no cervical adenopathy.  Neurological: He is alert and oriented to person, place, and time.  Skin: Skin is warm and dry. He is not diaphoretic.    ED Course  Procedures (including critical care time)  Labs Reviewed  URINALYSIS, ROUTINE W REFLEX MICROSCOPIC - Abnormal; Notable for the following:    Color, Urine  AMBER (*)  BIOCHEMICALS MAY BE AFFECTED BY COLOR   Hgb urine dipstick TRACE (*)     Bilirubin Urine MODERATE (*)     Ketones, ur 40 (*)     Protein, ur 100 (*)     All other components within normal limits  BASIC METABOLIC PANEL - Abnormal; Notable for the following:    Potassium 3.4 (*)     Glucose, Bld 116 (*)     All other components within normal limits  CBC WITH DIFFERENTIAL - Abnormal; Notable for the following:    Hemoglobin 17.5 (*)     All other components within normal  limits  URINE MICROSCOPIC-ADD ON   No results found.   No diagnosis found.    MDM  The patient presents with the complaints of abd, nausea, and vomiting.  Fluids were given and labs were obtained.  These were essentially unremarkable.  There was no evidence for pancreatitis or elevation of lfts.  The wbc was within normal range and the patient was afebrile.  The exam was benign and I believe these symptoms are likely viral in nature.  He was hydrated and medicated and is feeling better.  He will be discharged to home, to return prn for high fever, bloody emesis, or if he experiences further problems.          Geoffery Lyons, MD 12/21/11 [phone]

## 2014-04-17 ENCOUNTER — Encounter (HOSPITAL_COMMUNITY)
Admission: EM | Disposition: A | Discharge status: Home / Self Care | Payer: Self-pay | Source: Home / Self Care | Attending: Neurosurgery

## 2014-04-17 ENCOUNTER — Inpatient Hospital Stay (HOSPITAL_COMMUNITY): Payer: Self-pay | Admitting: Anesthesiology

## 2014-04-17 ENCOUNTER — Inpatient Hospital Stay (HOSPITAL_COMMUNITY)
Admission: EM | Admit: 2014-04-17 | Discharge: 2014-04-22 | DRG: 027 | Disposition: A | Discharge status: Home / Self Care | Payer: Self-pay | Attending: Neurosurgery | Admitting: Neurosurgery

## 2014-04-17 ENCOUNTER — Emergency Department (HOSPITAL_COMMUNITY): Payer: Self-pay

## 2014-04-17 ENCOUNTER — Encounter (HOSPITAL_COMMUNITY): Payer: Self-pay | Admitting: Physical Medicine and Rehabilitation

## 2014-04-17 DIAGNOSIS — S065X0A Traumatic subdural hemorrhage without loss of consciousness, initial encounter: Principal | ICD-10-CM | POA: Diagnosis present

## 2014-04-17 DIAGNOSIS — S02402A Zygomatic fracture, unspecified, initial encounter for closed fracture: Secondary | ICD-10-CM | POA: Diagnosis present

## 2014-04-17 DIAGNOSIS — S0993XA Unspecified injury of face, initial encounter: Secondary | ICD-10-CM | POA: Diagnosis present

## 2014-04-17 DIAGNOSIS — I10 Essential (primary) hypertension: Secondary | ICD-10-CM | POA: Diagnosis present

## 2014-04-17 DIAGNOSIS — S028XXA Fractures of other specified skull and facial bones, initial encounter for closed fracture: Secondary | ICD-10-CM | POA: Diagnosis present

## 2014-04-17 DIAGNOSIS — S064X9A Epidural hemorrhage with loss of consciousness of unspecified duration, initial encounter: Secondary | ICD-10-CM | POA: Diagnosis present

## 2014-04-17 DIAGNOSIS — S0990XA Unspecified injury of head, initial encounter: Secondary | ICD-10-CM

## 2014-04-17 DIAGNOSIS — F1010 Alcohol abuse, uncomplicated: Secondary | ICD-10-CM | POA: Diagnosis present

## 2014-04-17 DIAGNOSIS — F17210 Nicotine dependence, cigarettes, uncomplicated: Secondary | ICD-10-CM | POA: Diagnosis present

## 2014-04-17 DIAGNOSIS — S065X9A Traumatic subdural hemorrhage with loss of consciousness of unspecified duration, initial encounter: Secondary | ICD-10-CM

## 2014-04-17 DIAGNOSIS — S02401A Maxillary fracture, unspecified, initial encounter for closed fracture: Secondary | ICD-10-CM | POA: Diagnosis present

## 2014-04-17 DIAGNOSIS — S065XAA Traumatic subdural hemorrhage with loss of consciousness status unknown, initial encounter: Secondary | ICD-10-CM | POA: Diagnosis present

## 2014-04-17 HISTORY — PX: CRANIOTOMY: SHX93

## 2014-04-17 LAB — PROTIME-INR
INR: 1.06 (ref 0.00–1.49)
PROTHROMBIN TIME: 13.9 s (ref 11.6–15.2)

## 2014-04-17 LAB — I-STAT CHEM 8, ED
BUN: 8 mg/dL (ref 6–23)
CHLORIDE: 99 meq/L (ref 96–112)
CREATININE: 0.7 mg/dL (ref 0.50–1.35)
Calcium, Ion: 1.03 mmol/L — ABNORMAL LOW (ref 1.12–1.23)
Glucose, Bld: 104 mg/dL — ABNORMAL HIGH (ref 70–99)
HCT: 54 % — ABNORMAL HIGH (ref 39.0–52.0)
Hemoglobin: 18.4 g/dL — ABNORMAL HIGH (ref 13.0–17.0)
POTASSIUM: 4.2 mmol/L (ref 3.5–5.1)
SODIUM: 139 mmol/L (ref 135–145)
TCO2: 21 mmol/L (ref 0–100)

## 2014-04-17 LAB — CBC WITH DIFFERENTIAL/PLATELET
BASOS PCT: 0 % (ref 0–1)
Basophils Absolute: 0 10*3/uL (ref 0.0–0.1)
EOS ABS: 0 10*3/uL (ref 0.0–0.7)
Eosinophils Relative: 0 % (ref 0–5)
HCT: 48 % (ref 39.0–52.0)
HEMOGLOBIN: 16.3 g/dL (ref 13.0–17.0)
Lymphocytes Relative: 8 % — ABNORMAL LOW (ref 12–46)
Lymphs Abs: 0.8 10*3/uL (ref 0.7–4.0)
MCH: 31.9 pg (ref 26.0–34.0)
MCHC: 34 g/dL (ref 30.0–36.0)
MCV: 93.9 fL (ref 78.0–100.0)
MONO ABS: 0.6 10*3/uL (ref 0.1–1.0)
MONOS PCT: 6 % (ref 3–12)
NEUTROS PCT: 86 % — AB (ref 43–77)
Neutro Abs: 9.1 10*3/uL — ABNORMAL HIGH (ref 1.7–7.7)
Platelets: 275 10*3/uL (ref 150–400)
RBC: 5.11 MIL/uL (ref 4.22–5.81)
RDW: 14 % (ref 11.5–15.5)
WBC: 10.4 10*3/uL (ref 4.0–10.5)

## 2014-04-17 LAB — BASIC METABOLIC PANEL
ANION GAP: 16 — AB (ref 5–15)
BUN: 6 mg/dL (ref 6–23)
CALCIUM: 8.8 mg/dL (ref 8.4–10.5)
CHLORIDE: 100 meq/L (ref 96–112)
CO2: 23 mmol/L (ref 19–32)
Creatinine, Ser: 0.72 mg/dL (ref 0.50–1.35)
GFR calc Af Amer: >90 mL/min (ref >90)
GFR calc non Af Amer: >90 mL/min (ref >90)
GLUCOSE: 108 mg/dL — AB (ref 70–99)
Potassium: 4.2 mmol/L (ref 3.5–5.1)
SODIUM: 139 mmol/L (ref 135–145)

## 2014-04-17 LAB — APTT: aPTT: 29 seconds (ref 24–37)

## 2014-04-17 LAB — I-STAT CG4 LACTIC ACID, ED: LACTIC ACID, VENOUS: 6.27 mmol/L — AB (ref 0.5–2.2)

## 2014-04-17 LAB — ETHANOL: Alcohol, Ethyl (B): 104 mg/dL — ABNORMAL HIGH (ref 0–9)

## 2014-04-17 SURGERY — CRANIOTOMY HEMATOMA EVACUATION SUBDURAL
Anesthesia: General | Site: Head | Laterality: Left

## 2014-04-17 MED ORDER — ESMOLOL HCL 10 MG/ML IV SOLN
INTRAVENOUS | Status: AC
  Filled 2014-04-17: qty 10

## 2014-04-17 MED ORDER — MANNITOL 25 % IV SOLN
INTRAVENOUS | Status: AC
  Filled 2014-04-17: qty 100

## 2014-04-17 MED ORDER — ADULT MULTIVITAMIN W/MINERALS CH
1.0000 | ORAL_TABLET | Freq: Every day | ORAL | Status: DC
  Administered 2014-04-18 – 2014-04-22 (×5): 1 via ORAL
  Filled 2014-04-17 (×7): qty 1

## 2014-04-17 MED ORDER — FENTANYL CITRATE 0.05 MG/ML IJ SOLN
INTRAMUSCULAR | Status: AC
  Filled 2014-04-17: qty 5

## 2014-04-17 MED ORDER — LIDOCAINE HCL (CARDIAC) 20 MG/ML IV SOLN
INTRAVENOUS | Status: AC
  Filled 2014-04-17: qty 5

## 2014-04-17 MED ORDER — MIDAZOLAM HCL 2 MG/2ML IJ SOLN
INTRAMUSCULAR | Status: AC
  Filled 2014-04-17: qty 2

## 2014-04-17 MED ORDER — LABETALOL HCL 5 MG/ML IV SOLN
10.0000 mg | INTRAVENOUS | Status: DC | PRN
  Administered 2014-04-19 (×2): 10 mg via INTRAVENOUS
  Administered 2014-04-20: 20 mg via INTRAVENOUS
  Filled 2014-04-17: qty 8

## 2014-04-17 MED ORDER — HYDRALAZINE HCL 20 MG/ML IJ SOLN
INTRAMUSCULAR | Status: DC | PRN
  Administered 2014-04-17: 5 mg via INTRAVENOUS

## 2014-04-17 MED ORDER — ONDANSETRON HCL 4 MG/2ML IJ SOLN
4.0000 mg | Freq: Once | INTRAMUSCULAR | Status: AC
  Administered 2014-04-17: 4 mg via INTRAVENOUS
  Filled 2014-04-17: qty 2

## 2014-04-17 MED ORDER — VITAMIN B-1 100 MG PO TABS
100.0000 mg | ORAL_TABLET | Freq: Every day | ORAL | Status: DC
  Administered 2014-04-18 – 2014-04-22 (×5): 100 mg via ORAL
  Filled 2014-04-17 (×7): qty 1

## 2014-04-17 MED ORDER — FOLIC ACID 1 MG PO TABS
1.0000 mg | ORAL_TABLET | Freq: Every day | ORAL | Status: DC
  Administered 2014-04-18 – 2014-04-22 (×5): 1 mg via ORAL
  Filled 2014-04-17 (×7): qty 1

## 2014-04-17 MED ORDER — GLYCOPYRROLATE 0.2 MG/ML IJ SOLN
INTRAMUSCULAR | Status: DC | PRN
  Administered 2014-04-17: 0.6 mg via INTRAVENOUS

## 2014-04-17 MED ORDER — SODIUM CHLORIDE 0.9 % IV SOLN
INTRAVENOUS | Status: DC | PRN
  Administered 2014-04-17: 18:00:00 via INTRAVENOUS

## 2014-04-17 MED ORDER — DOCUSATE SODIUM 100 MG PO CAPS
100.0000 mg | ORAL_CAPSULE | Freq: Two times a day (BID) | ORAL | Status: DC
  Administered 2014-04-18 – 2014-04-22 (×8): 100 mg via ORAL
  Filled 2014-04-17 (×13): qty 1

## 2014-04-17 MED ORDER — ACETAMINOPHEN 325 MG PO TABS
650.0000 mg | ORAL_TABLET | ORAL | Status: DC | PRN
  Administered 2014-04-19 – 2014-04-21 (×4): 650 mg via ORAL
  Filled 2014-04-17 (×4): qty 2

## 2014-04-17 MED ORDER — NICARDIPINE HCL IN NACL 20-0.86 MG/200ML-% IV SOLN
INTRAVENOUS | Status: DC | PRN
  Administered 2014-04-17: 5 mg/h via INTRAVENOUS

## 2014-04-17 MED ORDER — LEVETIRACETAM IN NACL 500 MG/100ML IV SOLN
500.0000 mg | Freq: Two times a day (BID) | INTRAVENOUS | Status: DC
  Administered 2014-04-17 – 2014-04-22 (×10): 500 mg via INTRAVENOUS
  Filled 2014-04-17 (×14): qty 100

## 2014-04-17 MED ORDER — LORAZEPAM 1 MG PO TABS
1.0000 mg | ORAL_TABLET | Freq: Four times a day (QID) | ORAL | Status: AC | PRN
  Administered 2014-04-19: 1 mg via ORAL
  Filled 2014-04-17: qty 1

## 2014-04-17 MED ORDER — BACITRACIN ZINC 500 UNIT/GM EX OINT
TOPICAL_OINTMENT | CUTANEOUS | Status: DC | PRN
  Administered 2014-04-17: 1 via TOPICAL

## 2014-04-17 MED ORDER — LIDOCAINE HCL (CARDIAC) 20 MG/ML IV SOLN
INTRAVENOUS | Status: DC | PRN
  Administered 2014-04-17: 50 mg via INTRAVENOUS

## 2014-04-17 MED ORDER — LABETALOL HCL 5 MG/ML IV SOLN
INTRAVENOUS | Status: DC | PRN
  Administered 2014-04-17 (×3): 5 mg via INTRAVENOUS

## 2014-04-17 MED ORDER — ARTIFICIAL TEARS OP OINT
TOPICAL_OINTMENT | OPHTHALMIC | Status: DC | PRN
  Administered 2014-04-17: 1 via OPHTHALMIC

## 2014-04-17 MED ORDER — THIAMINE HCL 100 MG/ML IJ SOLN
100.0000 mg | Freq: Every day | INTRAMUSCULAR | Status: DC
  Administered 2014-04-17: 100 mg via INTRAVENOUS
  Filled 2014-04-17 (×3): qty 1

## 2014-04-17 MED ORDER — ESMOLOL HCL 10 MG/ML IV SOLN
INTRAVENOUS | Status: DC | PRN
  Administered 2014-04-17 (×2): 20 ug via INTRAVENOUS

## 2014-04-17 MED ORDER — POTASSIUM CHLORIDE IN NACL 20-0.9 MEQ/L-% IV SOLN
INTRAVENOUS | Status: DC
  Administered 2014-04-17 – 2014-04-19 (×3): 75 mL/h via INTRAVENOUS
  Administered 2014-04-19: 03:00:00 via INTRAVENOUS
  Filled 2014-04-17 (×6): qty 1000

## 2014-04-17 MED ORDER — ROCURONIUM BROMIDE 100 MG/10ML IV SOLN
INTRAVENOUS | Status: DC | PRN
  Administered 2014-04-17: 50 mg via INTRAVENOUS

## 2014-04-17 MED ORDER — HYDRALAZINE HCL 20 MG/ML IJ SOLN
INTRAMUSCULAR | Status: AC
  Filled 2014-04-17: qty 1

## 2014-04-17 MED ORDER — MICROFIBRILLAR COLL HEMOSTAT EX POWD
CUTANEOUS | Status: DC | PRN
  Administered 2014-04-17: 1 g via TOPICAL

## 2014-04-17 MED ORDER — SODIUM CHLORIDE 0.9 % IR SOLN
Status: DC | PRN
  Administered 2014-04-17: 500 mL

## 2014-04-17 MED ORDER — ONDANSETRON HCL 4 MG/2ML IJ SOLN: 4.0000 mg | INTRAMUSCULAR | Status: DC | PRN

## 2014-04-17 MED ORDER — MICROFIBRILLAR COLL HEMOSTAT EX PADS
MEDICATED_PAD | CUTANEOUS | Status: DC | PRN
  Administered 2014-04-17: 1 via TOPICAL

## 2014-04-17 MED ORDER — SUCCINYLCHOLINE CHLORIDE 20 MG/ML IJ SOLN
INTRAMUSCULAR | Status: DC | PRN
  Administered 2014-04-17: 100 mg via INTRAVENOUS

## 2014-04-17 MED ORDER — THROMBIN 20000 UNITS EX SOLR
CUTANEOUS | Status: DC | PRN
  Administered 2014-04-17: 20:00:00 via TOPICAL

## 2014-04-17 MED ORDER — LISINOPRIL 10 MG PO TABS
10.0000 mg | ORAL_TABLET | Freq: Every day | ORAL | Status: DC
  Administered 2014-04-18 – 2014-04-22 (×5): 10 mg via ORAL
  Filled 2014-04-17 (×7): qty 1

## 2014-04-17 MED ORDER — PROPOFOL 10 MG/ML IV BOLUS
INTRAVENOUS | Status: AC
  Filled 2014-04-17: qty 20

## 2014-04-17 MED ORDER — LABETALOL HCL 5 MG/ML IV SOLN
INTRAVENOUS | Status: AC
  Filled 2014-04-17: qty 4

## 2014-04-17 MED ORDER — NICARDIPINE HCL IN NACL 20-0.86 MG/200ML-% IV SOLN
3.0000 mg/h | INTRAVENOUS | Status: AC
  Administered 2014-04-17: 6 mg/h via INTRAVENOUS
  Filled 2014-04-17: qty 200

## 2014-04-17 MED ORDER — PROPOFOL 10 MG/ML IV BOLUS
INTRAVENOUS | Status: DC | PRN
  Administered 2014-04-17: 50 mg via INTRAVENOUS
  Administered 2014-04-17: 150 mg via INTRAVENOUS

## 2014-04-17 MED ORDER — LORAZEPAM 2 MG/ML IJ SOLN: 1.0000 mg | Freq: Four times a day (QID) | INTRAMUSCULAR | Status: AC | PRN

## 2014-04-17 MED ORDER — CEFAZOLIN SODIUM-DEXTROSE 2-3 GM-% IV SOLR
INTRAVENOUS | Status: DC | PRN
  Administered 2014-04-17: 2 g via INTRAVENOUS

## 2014-04-17 MED ORDER — ONDANSETRON HCL 4 MG/2ML IJ SOLN
INTRAMUSCULAR | Status: DC | PRN
  Administered 2014-04-17: 4 mg via INTRAVENOUS

## 2014-04-17 MED ORDER — PROMETHAZINE HCL 12.5 MG PO TABS
12.5000 mg | ORAL_TABLET | ORAL | Status: DC | PRN
  Filled 2014-04-17: qty 2

## 2014-04-17 MED ORDER — NICARDIPINE HCL IN NACL 20-0.86 MG/200ML-% IV SOLN
3.0000 mg/h | INTRAVENOUS | Status: DC
  Administered 2014-04-18 (×2): 10 mg/h via INTRAVENOUS
  Administered 2014-04-18: 8 mg/h via INTRAVENOUS
  Administered 2014-04-18 (×2): 10 mg/h via INTRAVENOUS
  Administered 2014-04-18: 5 mg/h via INTRAVENOUS
  Administered 2014-04-18 (×2): 10 mg/h via INTRAVENOUS
  Filled 2014-04-17 (×10): qty 200

## 2014-04-17 MED ORDER — HYDROCODONE-ACETAMINOPHEN 5-325 MG PO TABS
1.0000 | ORAL_TABLET | ORAL | Status: DC | PRN
  Administered 2014-04-18 – 2014-04-22 (×12): 1 via ORAL
  Filled 2014-04-17 (×13): qty 1

## 2014-04-17 MED ORDER — NEOSTIGMINE METHYLSULFATE 10 MG/10ML IV SOLN
INTRAVENOUS | Status: DC | PRN
  Administered 2014-04-17: 4 mg via INTRAVENOUS

## 2014-04-17 MED ORDER — CEFAZOLIN SODIUM-DEXTROSE 2-3 GM-% IV SOLR
2.0000 g | Freq: Three times a day (TID) | INTRAVENOUS | Status: AC
  Administered 2014-04-18 (×2): 2 g via INTRAVENOUS
  Filled 2014-04-17 (×2): qty 50

## 2014-04-17 MED ORDER — FENTANYL CITRATE 0.05 MG/ML IJ SOLN
INTRAMUSCULAR | Status: DC | PRN
  Administered 2014-04-17: 50 ug via INTRAVENOUS
  Administered 2014-04-17: 100 ug via INTRAVENOUS
  Administered 2014-04-17: 50 ug via INTRAVENOUS
  Administered 2014-04-17: 25 ug via INTRAVENOUS
  Administered 2014-04-17: 50 ug via INTRAVENOUS

## 2014-04-17 MED ORDER — BUPIVACAINE-EPINEPHRINE 0.5% -1:200000 IJ SOLN
INTRAMUSCULAR | Status: DC | PRN
  Administered 2014-04-17: 8 mL

## 2014-04-17 MED ORDER — 0.9 % SODIUM CHLORIDE (POUR BTL) OPTIME
TOPICAL | Status: DC | PRN
  Administered 2014-04-17 (×3): 1000 mL

## 2014-04-17 MED ORDER — ONDANSETRON HCL 4 MG PO TABS: 4.0000 mg | ORAL_TABLET | ORAL | Status: DC | PRN

## 2014-04-17 MED ORDER — MORPHINE SULFATE 2 MG/ML IJ SOLN
1.0000 mg | INTRAMUSCULAR | Status: DC | PRN
  Administered 2014-04-18 – 2014-04-20 (×6): 2 mg via INTRAVENOUS
  Filled 2014-04-17 (×8): qty 1

## 2014-04-17 MED ORDER — VECURONIUM BROMIDE 10 MG IV SOLR
INTRAVENOUS | Status: DC | PRN
  Administered 2014-04-17 (×2): 1 mg via INTRAVENOUS

## 2014-04-17 MED ORDER — PANTOPRAZOLE SODIUM 40 MG IV SOLR
40.0000 mg | Freq: Every day | INTRAVENOUS | Status: DC
  Administered 2014-04-17 – 2014-04-18 (×2): 40 mg via INTRAVENOUS
  Filled 2014-04-17 (×2): qty 40

## 2014-04-17 MED ORDER — ACETAMINOPHEN 650 MG RE SUPP: 650.0000 mg | RECTAL | Status: DC | PRN

## 2014-04-17 SURGICAL SUPPLY — 74 items
BAG DECANTER FOR FLEXI CONT (MISCELLANEOUS) ×3 IMPLANT
BIT DRILL WIRE PASS 1.3MM (BIT) IMPLANT
BLADE CLIPPER SURG NEURO (BLADE) ×3 IMPLANT
BRUSH SCRUB EZ 1% IODOPHOR (MISCELLANEOUS) IMPLANT
BRUSH SCRUB EZ PLAIN DRY (MISCELLANEOUS) ×3 IMPLANT
BUR ACORN 6.0 PRECISION (BURR) ×2 IMPLANT
BUR ACORN 6.0MM PRECISION (BURR) ×1
BUR ROUTER D-58 CRANI (BURR) ×3 IMPLANT
CANISTER SUCT 3000ML (MISCELLANEOUS) ×6 IMPLANT
CLIP RANEY DISP (INSTRUMENTS) ×3 IMPLANT
CLIP TI MEDIUM 6 (CLIP) IMPLANT
CONT SPEC 4OZ CLIKSEAL STRL BL (MISCELLANEOUS) ×3 IMPLANT
COVER BACK TABLE 60X90IN (DRAPES) IMPLANT
DRAIN JACKSON PRATT 10MM FLAT (MISCELLANEOUS) ×3 IMPLANT
DRAIN SNY WOU 7FLT (WOUND CARE) IMPLANT
DRAPE NEUROLOGICAL W/INCISE (DRAPES) ×3 IMPLANT
DRAPE SURG 17X23 STRL (DRAPES) ×3 IMPLANT
DRAPE WARM FLUID 44X44 (DRAPE) ×3 IMPLANT
DRILL WIRE PASS 1.3MM (BIT)
DRSG TELFA 3X8 NADH (GAUZE/BANDAGES/DRESSINGS) IMPLANT
ELECT CAUTERY BLADE 6.4 (BLADE) IMPLANT
ELECT REM PT RETURN 9FT ADLT (ELECTROSURGICAL) ×3
ELECTRODE REM PT RTRN 9FT ADLT (ELECTROSURGICAL) ×1 IMPLANT
EVACUATOR 1/8 PVC DRAIN (DRAIN) IMPLANT
EVACUATOR SILICONE 100CC (DRAIN) ×3 IMPLANT
FORCEPS BIPOLAR SPETZLER 8 1.0 (NEUROSURGERY SUPPLIES) ×3 IMPLANT
GAUZE SPONGE 4X4 12PLY STRL (GAUZE/BANDAGES/DRESSINGS) ×3 IMPLANT
GAUZE SPONGE 4X4 16PLY XRAY LF (GAUZE/BANDAGES/DRESSINGS) IMPLANT
GLOVE BIO SURGEON STRL SZ8.5 (GLOVE) ×3 IMPLANT
GLOVE BIOGEL PI IND STRL 7.0 (GLOVE) ×3 IMPLANT
GLOVE BIOGEL PI INDICATOR 7.0 (GLOVE) ×6
GLOVE EXAM NITRILE LRG STRL (GLOVE) IMPLANT
GLOVE EXAM NITRILE MD LF STRL (GLOVE) IMPLANT
GLOVE EXAM NITRILE XL STR (GLOVE) IMPLANT
GLOVE EXAM NITRILE XS STR PU (GLOVE) IMPLANT
GLOVE SS BIOGEL STRL SZ 8 (GLOVE) ×1 IMPLANT
GLOVE SUPERSENSE BIOGEL SZ 8 (GLOVE) ×2
GLOVE SURG SS PI 6.5 STRL IVOR (GLOVE) ×6 IMPLANT
GOWN STRL REUS W/ TWL LRG LVL3 (GOWN DISPOSABLE) ×2 IMPLANT
GOWN STRL REUS W/ TWL XL LVL3 (GOWN DISPOSABLE) ×1 IMPLANT
GOWN STRL REUS W/TWL LRG LVL3 (GOWN DISPOSABLE) ×6
GOWN STRL REUS W/TWL XL LVL3 (GOWN DISPOSABLE) ×3
KIT BASIN OR (CUSTOM PROCEDURE TRAY) ×3 IMPLANT
KIT ROOM TURNOVER OR (KITS) ×3 IMPLANT
MARKER SKIN DUAL TIP RULER LAB (MISCELLANEOUS) IMPLANT
NEEDLE HYPO 22GX1.5 SAFETY (NEEDLE) ×3 IMPLANT
NS IRRIG 1000ML POUR BTL (IV SOLUTION) ×6 IMPLANT
PACK CRANIOTOMY (CUSTOM PROCEDURE TRAY) ×3 IMPLANT
PAD ARMBOARD 7.5X6 YLW CONV (MISCELLANEOUS) ×9 IMPLANT
PATTIES SURGICAL .25X.25 (GAUZE/BANDAGES/DRESSINGS) IMPLANT
PATTIES SURGICAL .5 X.5 (GAUZE/BANDAGES/DRESSINGS) IMPLANT
PATTIES SURGICAL .5 X3 (DISPOSABLE) IMPLANT
PATTIES SURGICAL 1X1 (DISPOSABLE) IMPLANT
PIN MAYFIELD SKULL DISP (PIN) ×3 IMPLANT
PLATE 1.5  2HOLE MED NEURO (Plate) ×8 IMPLANT
PLATE 1.5 2HOLE MED NEURO (Plate) ×4 IMPLANT
RUBBERBAND STERILE (MISCELLANEOUS) IMPLANT
SCREW SELF DRILL HT 1.5/4MM (Screw) ×24 IMPLANT
SPONGE NEURO XRAY DETECT 1X3 (DISPOSABLE) IMPLANT
STAPLER SKIN PROX WIDE 3.9 (STAPLE) ×6 IMPLANT
SUT ETHILON 3 0 FSL (SUTURE) IMPLANT
SUT NURALON 4 0 TR CR/8 (SUTURE) ×6 IMPLANT
SUT PROLENE 6 0 BV (SUTURE) IMPLANT
SUT VIC AB 2-0 CP2 18 (SUTURE) ×9 IMPLANT
SUT VIC AB 3-0 FS2 27 (SUTURE) IMPLANT
SUT VICRYL 4-0 PS2 18IN ABS (SUTURE) IMPLANT
SYR CONTROL 10ML LL (SYRINGE) IMPLANT
TAPE CLOTH SURG 4X10 WHT LF (GAUZE/BANDAGES/DRESSINGS) ×3 IMPLANT
TOWEL OR 17X24 6PK STRL BLUE (TOWEL DISPOSABLE) ×3 IMPLANT
TOWEL OR 17X26 10 PK STRL BLUE (TOWEL DISPOSABLE) ×3 IMPLANT
TRAY FOLEY CATH 14FRSI W/METER (CATHETERS) IMPLANT
TRAY FOLEY CATH 16FRSI W/METER (SET/KITS/TRAYS/PACK) ×3 IMPLANT
UNDERPAD 30X30 INCONTINENT (UNDERPADS AND DIAPERS) IMPLANT
WATER STERILE IRR 1000ML POUR (IV SOLUTION) ×3 IMPLANT

## 2014-04-17 NOTE — ED Notes (Signed)
MD Jenkins at bedside.  

## 2014-04-17 NOTE — ED Notes (Signed)
Pt awaiting neurosurgeon to see patient in ED
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
## 2014-04-17 NOTE — Transfer of Care (Signed)
Immediate Anesthesia Transfer of Care Note  Patient: Gerald Morgan  Procedure(s) Performed: Procedure(s) with comments: CRANIOTOMY HEMATOMA EVACUATION SUBDURAL (Left) - left  Patient Location: PACU and ICU  Anesthesia Type:General  Level of Consciousness: sedated, patient cooperative and responds to stimulation  Airway & Oxygen Therapy: Patient Spontanous Breathing and Patient connected to face mask oxygen  Post-op Assessment: Report given to PACU RN, Post -op Vital signs reviewed and stable and Patient moving all extremities  Post vital signs: Reviewed and stable  Complications: No apparent anesthesia complications. Follows commands.

## 2014-04-17 NOTE — ED Notes (Signed)
PA Mintz at bedside. 
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
## 2014-04-17 NOTE — ED Notes (Signed)
Report given to 56M, RN aware that patient is waiting for Dr. Lovell Sheehan to evaluate him in the ED before transport

## 2014-04-17 NOTE — Anesthesia Preprocedure Evaluation (Addendum)
Anesthesia Evaluation  Patient identified by MRN, date of birth, ID band Patient awake    Reviewed: Allergy & Precautions, H&P , NPO status , Patient's Chart, lab work & pertinent test results  History of Anesthesia Complications Negative for: history of anesthetic complications  Airway Mallampati: II  TM Distance: >3 FB Neck ROM: Full    Dental  (+) Poor Dentition, Dental Advisory Given   Pulmonary Current Smoker,  breath sounds clear to auscultation        Cardiovascular hypertension, Rhythm:Regular Rate:Normal     Neuro/Psych negative psych ROS   GI/Hepatic negative GI ROS, (+)     substance abuse  alcohol use,   Endo/Other  negative endocrine ROS  Renal/GU negative Renal ROS     Musculoskeletal   Abdominal   Peds  Hematology   Anesthesia Other Findings   Reproductive/Obstetrics negative OB ROS                           Anesthesia Physical Anesthesia Plan  ASA: IV and emergent  Anesthesia Plan: General   Post-op Pain Management:    Induction: Intravenous  Airway Management Planned: Oral ETT  Additional Equipment:   Intra-op Plan:   Post-operative Plan: Extubation in OR  Informed Consent: I have reviewed the patients History and Physical, chart, labs and discussed the procedure including the risks, benefits and alternatives for the proposed anesthesia with the patient or authorized representative who has indicated his/her understanding and acceptance.   Dental advisory given  Plan Discussed with: CRNA and Anesthesiologist  Anesthesia Plan Comments:         Anesthesia Quick Evaluation
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
## 2014-04-17 NOTE — ED Notes (Signed)
MD Cornette at bedside

## 2014-04-17 NOTE — Anesthesia Postprocedure Evaluation (Signed)
  Anesthesia Post-op Note  Patient: Gerald Morgan  Procedure(s) Performed: Procedure(s) with comments: CRANIOTOMY HEMATOMA EVACUATION SUBDURAL (Left) - left  Patient Location: NICU  Anesthesia Type:General  Level of Consciousness: awake and lethargic  Airway and Oxygen Therapy: Patient Spontanous Breathing and Patient connected to nasal cannula oxygen  Post-op Pain: none  Post-op Assessment: Post-op Vital signs reviewed, Patient's Cardiovascular Status Stable, Respiratory Function Stable, Patent Airway, No signs of Nausea or vomiting and Pain level controlled  Post-op Vital Signs: stable  Last Vitals:  Filed Vitals:   04/17/14 2145  BP:   Pulse: 79  Temp:   Resp: 20    Complications: No apparent anesthesia complications
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
## 2014-04-17 NOTE — Anesthesia Procedure Notes (Signed)
Procedure Name: Intubation Date/Time: 04/17/2014 6:56 PM Performed by: Lovie Chol Pre-anesthesia Checklist: Patient identified, Emergency Drugs available, Suction available, Patient being monitored and Timeout performed Patient Re-evaluated:Patient Re-evaluated prior to inductionOxygen Delivery Method: Circle system utilized Preoxygenation: Pre-oxygenation with 100% oxygen Intubation Type: IV induction, Rapid sequence and Cricoid Pressure applied Laryngoscope Size: Miller and 2 Grade View: Grade I Tube type: Oral Tube size: 7.5 mm Number of attempts: 1 Airway Equipment and Method: Stylet Placement Confirmation: ETT inserted through vocal cords under direct vision,  positive ETCO2,  CO2 detector and breath sounds checked- equal and bilateral Secured at: 21 cm Tube secured with: Tape Dental Injury: Teeth and Oropharynx as per pre-operative assessment

## 2014-04-17 NOTE — ED Notes (Signed)
i-stat CG4 Lactic acid result given to Dr. Rubin Payor
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
## 2014-04-18 ENCOUNTER — Inpatient Hospital Stay (HOSPITAL_COMMUNITY): Payer: Self-pay

## 2014-04-18 LAB — MRSA PCR SCREENING: MRSA by PCR: NEGATIVE

## 2014-04-18 NOTE — Progress Notes (Signed)
Patient ID: Gerald Morgan, male   DOB: 09-Apr-1961, 54 y.o.   MRN: [id number] Patient is status post craniotomy for subdural hematoma doing very well awake and alert with minimal headache follow-up CT scan stable with complete resistor of subdural.  DC J-P drain.  DC Foley mobilize
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
## 2014-04-18 NOTE — Plan of Care (Signed)
Problem: Phase I Progression Outcomes Goal: Respiratory status stable Outcome: Completed/Met Date Met:  04/18/14 Pt is able to maintain/protect his airway without assistance.  Problem: Phase II Progression Outcomes Goal: Neuro exam at baseline or improved Outcome: Completed/Met Date Met:  04/18/14 Pt is alert and oriented x 4 now and at baseline.

## 2014-04-19 MED ORDER — HYDRALAZINE HCL 20 MG/ML IJ SOLN
20.0000 mg | INTRAMUSCULAR | Status: DC | PRN
  Administered 2014-04-19 – 2014-04-20 (×2): 20 mg via INTRAVENOUS
  Filled 2014-04-19 (×2): qty 1

## 2014-04-19 MED ORDER — INFLUENZA VAC SPLIT QUAD 0.5 ML IM SUSY
0.5000 mL | PREFILLED_SYRINGE | INTRAMUSCULAR | Status: AC
  Administered 2014-04-20: 0.5 mL via INTRAMUSCULAR
  Filled 2014-04-19: qty 0.5

## 2014-04-19 MED ORDER — PANTOPRAZOLE SODIUM 40 MG PO TBEC
40.0000 mg | DELAYED_RELEASE_TABLET | Freq: Every day | ORAL | Status: DC
  Administered 2014-04-20 – 2014-04-21 (×2): 40 mg via ORAL
  Filled 2014-04-19 (×2): qty 1
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
## 2014-04-20 ENCOUNTER — Encounter (HOSPITAL_COMMUNITY): Payer: Self-pay | Admitting: Neurosurgery

## 2014-04-20 NOTE — Progress Notes (Signed)
Patient ID: Gerald Morgan, male   DOB: March 21, 1961, 54 y.o.   MRN: [id number] Subjective:  The patient is alert and pleasant. He is in no apparent distress.  Objective: Vital signs in last 24 hours: Temp:  [97.5 F (36.4 C)-99 F (37.2 C)] 99 F (37.2 C) (01/04 0749) Pulse Rate:  [58-93] 68 (01/04 0700) Resp:  [14-26] 17 (01/04 0700) BP: (120-190)/(74-109) 173/94 mmHg (01/04 0700) SpO2:  [96 %-100 %] 100 % (01/04 0700)  Intake/Output from previous day: 01/03 0701 - 01/04 0700 In: 2500 [P.O.:600; I.V.:1800; IV Piggyback:100] Out: 1825 [Urine:1825] Intake/Output this shift:    Physical exam the patient is alert and oriented 3, Glasgow Coma Scale 15. His speech is normal. His strength is normal. His wound is healing well.  Lab Results:  Recent Labs  04/17/14 1500 04/17/14 1507  WBC 10.4  --   HGB 16.3 18.4*  HCT 48.0 54.0*  PLT 275  --    BMET  Recent Labs  04/17/14 1500 04/17/14 1507  NA 139 139  K 4.2 4.2  CL 100 99  CO2 23  --   GLUCOSE 108* 104*  BUN 6 8  CREATININE 0.72 0.70  CALCIUM 8.8  --     Studies/Results: No results found.  Assessment/Plan: Postop day #3: The patient is doing well. I will transfer him to the floor. He will likely go home in a few days. I advised him to quit drinking alcohol.  LOS: 3 days     Gerald Morgan D 04/20/2014, 7:56 AM

## 2014-04-20 NOTE — Plan of Care (Signed)
Problem: Consults Goal: Diagnosis - Craniotomy Outcome: Completed/Met Date Met:  04/20/14 [facility] Cran[facility]

## 2014-04-21 MED ORDER — HYDRALAZINE HCL 20 MG/ML IJ SOLN
10.0000 mg | INTRAMUSCULAR | Status: DC | PRN
  Administered 2014-04-21 – 2014-04-22 (×3): 10 mg via INTRAVENOUS
  Filled 2014-04-21 (×3): qty 1

## 2014-04-21 NOTE — Progress Notes (Signed)
Patient ID: Gerald Morgan, male   DOB: [birth year], 54 y.o.   MRN: [id number] Subjective:  The patient is alert and pleasant. He at times feels a bit dizzy. He is in no apparent distress. He wants to go home tomorrow.  Objective: Vital signs in last 24 hours: Temp:  [97.5 F (36.4 C)-99.3 F (37.4 C)] 99.3 F (37.4 C) (01/05 0607) Pulse Rate:  [54-77] 64 (01/05 0607) Resp:  [14-16] 16 (01/05 0607) BP: (156-179)/(79-113) 179/113 mmHg (01/05 0607) SpO2:  [98 %-100 %] 100 % (01/05 0607)  Intake/Output from previous day: 01/04 0701 - 01/05 0700 In: 805 [P.O.:240; I.V.:465; IV Piggyback:100] Out: 750 [Urine:750] Intake/Output this shift:    Physical exam the patient is alert and oriented 3. His speech is normal. His strength is normal. His wound is healing well.  Lab Results: No results for input(s): WBC, HGB, HCT, PLT in the last 72 hours. BMET No results for input(s): NA, K, CL, CO2, GLUCOSE, BUN, CREATININE, CALCIUM in the last 72 hours.  Studies/Results: No results found.  Assessment/Plan: Postop day #4: The patient is doing well. He will likely go home tomorrow.  LOS: 4 days     Norberta Stobaugh D 04/21/2014, 8:00 AM

## 2014-04-22 MED ORDER — DSS 100 MG PO CAPS: 100.0000 mg | ORAL_CAPSULE | Freq: Two times a day (BID) | ORAL | Status: DC

## 2014-04-22 MED ORDER — HYDROCODONE-ACETAMINOPHEN 5-325 MG PO TABS: 1.0000 | ORAL_TABLET | ORAL | Status: DC | PRN

## 2014-04-22 NOTE — Progress Notes (Signed)
Met with patient and brother Lavell Anchors to discuss discharge instructions. Brother requested assistance with follow up for substance abuse. Contacted local [facility]) and brother made arrangements for patient to be seen for services tomorrow morning at 9 am. Discussed with patient who was agreeable to follow up substance abuse services. Also advised of the importance of follow up with surgeon.

## 2014-04-22 NOTE — Progress Notes (Signed)
Pt was discharged and walked to the front lobby with nurse tech at 1400. Brother Tonye BecketDamien [place] transportation [place]

## 2014-04-22 NOTE — Discharge Summary (Signed)
Physician Discharge Summary  Patient ID: Gerald Morgan MRN: [id number] DOB/AGE: 52/30/1962 52 y.o.  Admit date: 04/17/2014 Discharge date: 04/22/2014  Admission Diagnoses: Acute left subdural hematoma, alcohol abuse  Discharge Diagnoses: The same Active Problems:   Facial trauma   Subdural hematoma caused by concussion   Discharged Condition: good  Hospital Course: I performed a left craniotomy for evacuation of subdural hematoma on the patient on 04/17/2014. The surgery went well.  The patient's postoperative course was unremarkable. On 04/22/2014 the patient requested discharge home. He was given oral and written discharge instructions. All his questions were answered. He was instructed to follow-up with me in about a week to get his staples removed. He was instructed to stop drinking alcohol.  Consults: None Significant Diagnostic Studies: Head CT's Treatments: Left craniotomy for evacuation of subdural hematoma Discharge Exam: Blood pressure 141/88, pulse 79, temperature 98.8 F (37.1 C), temperature source Oral, resp. rate 20, height  (1.753 m), weight 63.3 kg (139 lb 8.8 oz), SpO2 99 %. The patient is alert and oriented 3, Glasgow Coma Scale 15, his strength is normal in all 4 extremities. His speech is normal. His pupils are equal. His wound is healing well without signs of infection.  Disposition: Home  Discharge Instructions    Call MD for:  difficulty breathing, headache or visual disturbances    Complete by:  As directed      Call MD for:  extreme fatigue    Complete by:  As directed      Call MD for:  hives    Complete by:  As directed      Call MD for:  persistant dizziness or light-headedness    Complete by:  As directed      Call MD for:  persistant nausea and vomiting    Complete by:  As directed      Call MD for:  redness, tenderness, or signs of infection (pain, swelling, redness, odor or green/yellow discharge around incision site)    Complete by:   As directed      Call MD for:  severe uncontrolled pain    Complete by:  As directed      Call MD for:  temperature >100.4    Complete by:  As directed      Diet - low sodium heart healthy    Complete by:  As directed      Discharge instructions    Complete by:  As directed   Call [phone] for a followup appointment. Take a stool softener while you are using pain medications.     Driving Restrictions    Complete by:  As directed   Do not drive for 2 weeks.     Increase activity slowly    Complete by:  As directed      Lifting restrictions    Complete by:  As directed   Do not lift more than 5 pounds. No excessive bending or twisting.     May shower / Bathe    Complete by:  As directed   He may shower after the pain she is removed 3 days after surgery. Leave the incision alone.     No dressing needed    Complete by:  As directed             Medication List    STOP taking these medications        acetaminophen 500 MG tablet  Commonly known as:  TYLENOL  butalbital-acetaminophen-caffeine 50-325-40 MG per tablet  Commonly known as:  FIORICET, ESGIC      TAKE these medications        DSS 100 MG Caps  Take 100 mg by mouth 2 (two) times daily.     HYDROcodone-acetaminophen 5-325 MG per tablet  Commonly known as:  NORCO/VICODIN  Take 1 tablet by mouth every 4 (four) hours as needed for moderate pain.     lisinopril 10 MG tablet  Commonly known as:  PRINIVIL,ZESTRIL  Take 10 mg by mouth daily.     promethazine 25 MG tablet  Commonly known as:  PHENERGAN  Take 1 tablet (25 mg total) by mouth every 6 (six) hours as needed for nausea.         SignedTressie Stalker: Ettel Albergo D 04/22/2014, 8:14 AM

## 2019-12-08 ENCOUNTER — Emergency Department (HOSPITAL_COMMUNITY): Payer: Self-pay

## 2019-12-08 ENCOUNTER — Inpatient Hospital Stay (HOSPITAL_COMMUNITY)
Admission: EM | Admit: 2019-12-08 | Discharge: 2019-12-13 | DRG: 064 | Disposition: A | Discharge status: Home / Self Care | Payer: Self-pay | Attending: Internal Medicine | Admitting: Internal Medicine

## 2019-12-08 ENCOUNTER — Other Ambulatory Visit: Payer: Self-pay

## 2019-12-08 DIAGNOSIS — R29700 NIHSS score 0: Secondary | ICD-10-CM | POA: Diagnosis not present

## 2019-12-08 DIAGNOSIS — I10 Essential (primary) hypertension: Secondary | ICD-10-CM

## 2019-12-08 DIAGNOSIS — Z79899 Other long term (current) drug therapy: Secondary | ICD-10-CM

## 2019-12-08 DIAGNOSIS — I161 Hypertensive emergency: Secondary | ICD-10-CM | POA: Diagnosis present

## 2019-12-08 DIAGNOSIS — J9601 Acute respiratory failure with hypoxia: Secondary | ICD-10-CM | POA: Diagnosis present

## 2019-12-08 DIAGNOSIS — I129 Hypertensive chronic kidney disease with stage 1 through stage 4 chronic kidney disease, or unspecified chronic kidney disease: Secondary | ICD-10-CM | POA: Diagnosis present

## 2019-12-08 DIAGNOSIS — I160 Hypertensive urgency: Secondary | ICD-10-CM | POA: Diagnosis present

## 2019-12-08 DIAGNOSIS — I6330 Cerebral infarction due to thrombosis of unspecified cerebral artery: Secondary | ICD-10-CM

## 2019-12-08 DIAGNOSIS — N1831 Chronic kidney disease, stage 3a: Secondary | ICD-10-CM | POA: Diagnosis present

## 2019-12-08 DIAGNOSIS — F1210 Cannabis abuse, uncomplicated: Secondary | ICD-10-CM | POA: Diagnosis present

## 2019-12-08 DIAGNOSIS — J810 Acute pulmonary edema: Secondary | ICD-10-CM

## 2019-12-08 DIAGNOSIS — R42 Dizziness and giddiness: Secondary | ICD-10-CM

## 2019-12-08 DIAGNOSIS — Y92239 Unspecified place in hospital as the place of occurrence of the external cause: Secondary | ICD-10-CM | POA: Diagnosis not present

## 2019-12-08 DIAGNOSIS — T501X5A Adverse effect of loop [high-ceiling] diuretics, initial encounter: Secondary | ICD-10-CM | POA: Diagnosis not present

## 2019-12-08 DIAGNOSIS — Z87820 Personal history of traumatic brain injury: Secondary | ICD-10-CM

## 2019-12-08 DIAGNOSIS — E876 Hypokalemia: Secondary | ICD-10-CM | POA: Diagnosis present

## 2019-12-08 DIAGNOSIS — R2681 Unsteadiness on feet: Secondary | ICD-10-CM | POA: Diagnosis present

## 2019-12-08 DIAGNOSIS — N179 Acute kidney failure, unspecified: Secondary | ICD-10-CM | POA: Diagnosis present

## 2019-12-08 DIAGNOSIS — E785 Hyperlipidemia, unspecified: Secondary | ICD-10-CM | POA: Diagnosis present

## 2019-12-08 DIAGNOSIS — R069 Unspecified abnormalities of breathing: Secondary | ICD-10-CM

## 2019-12-08 DIAGNOSIS — I776 Arteritis, unspecified: Secondary | ICD-10-CM | POA: Diagnosis present

## 2019-12-08 DIAGNOSIS — F17210 Nicotine dependence, cigarettes, uncomplicated: Secondary | ICD-10-CM | POA: Diagnosis present

## 2019-12-08 DIAGNOSIS — Z20822 Contact with and (suspected) exposure to covid-19: Secondary | ICD-10-CM | POA: Diagnosis present

## 2019-12-08 DIAGNOSIS — G9389 Other specified disorders of brain: Secondary | ICD-10-CM | POA: Diagnosis present

## 2019-12-08 DIAGNOSIS — I63312 Cerebral infarction due to thrombosis of left middle cerebral artery: Principal | ICD-10-CM | POA: Diagnosis present

## 2019-12-08 LAB — CBC
HCT: 43.2 % (ref 39.0–52.0)
Hemoglobin: 14.1 g/dL (ref 13.0–17.0)
MCH: 30.4 pg (ref 26.0–34.0)
MCHC: 32.6 g/dL (ref 30.0–36.0)
MCV: 93.1 fL (ref 80.0–100.0)
Platelets: 244 10*3/uL (ref 150–400)
RBC: 4.64 MIL/uL (ref 4.22–5.81)
RDW: 13.5 % (ref 11.5–15.5)
WBC: 6.5 10*3/uL (ref 4.0–10.5)
nRBC: 0 % (ref 0.0–0.2)

## 2019-12-08 LAB — BASIC METABOLIC PANEL
Anion gap: 10 (ref 5–15)
BUN: 11 mg/dL (ref 6–20)
CO2: 27 mmol/L (ref 22–32)
Calcium: 8.8 mg/dL — ABNORMAL LOW (ref 8.9–10.3)
Chloride: 101 mmol/L (ref 98–111)
Creatinine, Ser: 1.53 mg/dL — ABNORMAL HIGH (ref 0.61–1.24)
GFR calc Af Amer: 58 mL/min — ABNORMAL LOW (ref >60)
GFR calc non Af Amer: 50 mL/min — ABNORMAL LOW (ref >60)
Glucose, Bld: 109 mg/dL — ABNORMAL HIGH (ref 70–99)
Potassium: 3.2 mmol/L — ABNORMAL LOW (ref 3.5–5.1)
Sodium: 138 mmol/L (ref 135–145)

## 2019-12-08 LAB — URINALYSIS, ROUTINE W REFLEX MICROSCOPIC
Bacteria, UA: NONE SEEN
Bilirubin Urine: NEGATIVE
Glucose, UA: 50 mg/dL — AB
Ketones, ur: NEGATIVE mg/dL
Leukocytes,Ua: NEGATIVE
Nitrite: NEGATIVE
Protein, ur: 100 mg/dL — AB
Specific Gravity, Urine: 1.016 (ref 1.005–1.030)
pH: 5 (ref 5.0–8.0)

## 2019-12-08 LAB — CBG MONITORING, ED: Glucose-Capillary: 106 mg/dL — ABNORMAL HIGH (ref 70–99)

## 2019-12-08 LAB — MAGNESIUM: Magnesium: 2 mg/dL (ref 1.7–2.4)

## 2019-12-08 LAB — TROPONIN I (HIGH SENSITIVITY): Troponin I (High Sensitivity): 43 ng/L — ABNORMAL HIGH (ref <18)

## 2019-12-08 MED ORDER — FUROSEMIDE 10 MG/ML IJ SOLN
40.0000 mg | Freq: Once | INTRAMUSCULAR | Status: AC
  Administered 2019-12-08: 40 mg via INTRAVENOUS
  Filled 2019-12-08: qty 4

## 2019-12-08 MED ORDER — NITROGLYCERIN IN D5W 200-5 MCG/ML-% IV SOLN: 0.0000 ug/min | INTRAVENOUS | Status: DC

## 2019-12-08 MED ORDER — LORAZEPAM 2 MG/ML IJ SOLN: 0.5000 mg | Freq: Once | INTRAMUSCULAR | Status: DC

## 2019-12-08 MED ORDER — IPRATROPIUM BROMIDE 0.02 % IN SOLN
0.5000 mg | Freq: Once | RESPIRATORY_TRACT | Status: DC
  Filled 2019-12-08: qty 2.5

## 2019-12-08 MED ORDER — ALBUTEROL SULFATE (2.5 MG/3ML) 0.083% IN NEBU
5.0000 mg | INHALATION_SOLUTION | Freq: Once | RESPIRATORY_TRACT | Status: DC
  Filled 2019-12-08: qty 6

## 2019-12-08 MED ORDER — SODIUM CHLORIDE 0.9 % IV BOLUS
1000.0000 mL | Freq: Once | INTRAVENOUS | Status: AC
  Administered 2019-12-08: 1000 mL via INTRAVENOUS

## 2019-12-08 MED ORDER — NITROGLYCERIN IN D5W 200-5 MCG/ML-% IV SOLN
INTRAVENOUS | Status: AC
  Administered 2019-12-08: 5 ug/min via INTRAVENOUS
  Filled 2019-12-08: qty 250

## 2019-12-08 MED ORDER — LABETALOL HCL 5 MG/ML IV SOLN
10.0000 mg | Freq: Once | INTRAVENOUS | Status: AC
  Administered 2019-12-08: 10 mg via INTRAVENOUS
  Filled 2019-12-08: qty 4

## 2019-12-08 MED ORDER — LORAZEPAM 2 MG/ML IJ SOLN
INTRAMUSCULAR | Status: AC
  Administered 2019-12-08: 0.5 mg via INTRAVENOUS
  Filled 2019-12-08: qty 1

## 2019-12-08 MED ORDER — LORAZEPAM 2 MG/ML IJ SOLN: 0.5000 mg | Freq: Once | INTRAMUSCULAR | Status: AC

## 2019-12-08 NOTE — ED Provider Notes (Signed)
[facility] EMERGENCY DEPARTMENT Provider Note   CSN: [id number] Arrival date & time: 12/08/19  1219     History Chief Complaint  Patient presents with  . Dizziness    Gerald Morgan is a 59 y.o. male.  The history is provided by the patient, a relative and medical records. No language interpreter was used.  Dizziness Quality:  Imbalance Severity:  Moderate Onset quality:  Unable to specify Context: standing up      59 year old male with history of hypertension currently not on any blood pressure medication brought here accompanied by his sister for evaluation of dizziness.  Patient report acute onset of dizziness ongoing for the past 3 days.  He described as feeling imbalance when he walks with a zigzag pattern which is new.  Symptoms brought on usually with positional change and states he feels normal at rest.  He denies having any fever, chills, vision changes, headache, neck pain, confusion, chest pain, trouble breathing, abdominal pain, back pain, focal numbness or focal weakness.  He has not been vaccinated for Covid.  He does not take any blood pressure medication.  He has not been seen by PCP in quite a while.  His sister has noticed that he is walking with a limp and is unsteady.  Also mentioned that he drinks alcohol heavily.  Patient does admits to alcohol use as well as regular tobacco use but denies any drug use aside from occasional marijuana use.  Patient has had prior craniotomy for epidural hematoma in 2013.  Patient denies having any URI symptoms, no loss of taste or smell no runny nose sneezing or coughing.  Patient denies any specific treatment tried.   Past Medical History:  Diagnosis Date  . Hypertension     Patient Active Problem List   Diagnosis Date Noted  . Facial trauma 04/17/2014  . Subdural hematoma caused by concussion (HCC) 04/17/2014    Past Surgical History:  Procedure Laterality Date  . BRAIN SURGERY    . CRANIOTOMY   09/19/2011   Procedure: CRANIOTOMY HEMATOMA EVACUATION EPIDURAL;  Surgeon: Barnett Abu, MD;  Location: [facility];  Service: Neurosurgery;  Laterality: Left;  Redo craniotomy for evacuation of epidural hematoma  . CRANIOTOMY Left 04/17/2014   Procedure: CRANIOTOMY HEMATOMA EVACUATION SUBDURAL;  Surgeon: Tressie Stalker, MD;  Location: [facility];  Service: Neurosurgery;  Laterality: Left;  left       No family history on file.  Social History   Tobacco Use  . Smoking status: Current Every Day Smoker    Packs/day: 0.50    Types: Cigarettes  . Smokeless tobacco: Never Used  Substance Use Topics  . Alcohol use: Yes  . Drug use: No    Home Medications Prior to Admission medications   Medication Sig Start Date End Date Taking? Authorizing Provider  docusate sodium 100 MG CAPS Take 100 mg by mouth 2 (two) times daily. 04/22/14   Tressie Stalker, MD  HYDROcodone-acetaminophen (NORCO/VICODIN) 5-325 MG per tablet Take 1 tablet by mouth every 4 (four) hours as needed for moderate pain. 04/22/14   Tressie Stalker, MD  lisinopril (PRINIVIL,ZESTRIL) 10 MG tablet Take 10 mg by mouth daily.    [provider]  promethazine (PHENERGAN) 25 MG tablet Take 1 tablet (25 mg total) by mouth every 6 (six) hours as needed for nausea. Patient not taking: Reported on 04/17/2014 12/20/11 12/27/11  Geoffery Lyons, MD    Allergies    Patient has no known allergies.  Review  of Systems   Review of Systems  Neurological: Positive for dizziness.  All other systems reviewed and are negative.   Physical Exam Updated Vital Signs BP (!) 215/147 (BP Location: Right Arm)   Pulse 83   Temp 98.5 F (36.9 C) (Oral)   Resp 16   Ht 5\' 9"  (1.753 m)   Wt 72.6 kg   SpO2 100%   BMI 23.63 kg/m   Physical Exam Vitals and nursing note reviewed.  Constitutional:      General: He is not in acute distress.    Appearance: He is well-developed.     Comments: Patient appears older than stated age  HENT:      Head: Atraumatic.  Eyes:     Extraocular Movements: Extraocular movements intact.     Conjunctiva/sclera: Conjunctivae normal.     Pupils: Pupils are equal, round, and reactive to light.  Cardiovascular:     Rate and Rhythm: Normal rate and regular rhythm.     Pulses: Normal pulses.     Heart sounds: Normal heart sounds.  Pulmonary:     Effort: Pulmonary effort is normal.     Breath sounds: Normal breath sounds.  Abdominal:     Palpations: Abdomen is soft.     Tenderness: There is no abdominal tenderness.  Musculoskeletal:     Cervical back: Normal range of motion and neck supple. No rigidity.  Skin:    Findings: No rash.  Neurological:     Mental Status: He is alert and oriented to person, place, and time.     Comments: Neurologic exam:  Speech clear, pupils equal round reactive to light, extraocular movements intact  Normal peripheral visual fields Cranial nerves III through XII normal including no facial droop Follows commands, moves all extremities x4, normal strength to bilateral upper and lower extremities at all major muscle groups including grip Sensation normal to light touch Coordination intact, no limb ataxia, finger-nose-finger normal Rapid alternating movements normal No pronator drift Gait normal   Psychiatric:        Mood and Affect: Mood normal.     ED Results / Procedures / Treatments   Labs (all labs ordered are listed, but only abnormal results are displayed) Labs Reviewed  BASIC METABOLIC PANEL - Abnormal; Notable for the following components:      Result Value   Potassium 3.2 (*)    Glucose, Bld 109 (*)    Creatinine, Ser 1.53 (*)    Calcium 8.8 (*)    GFR calc non Af Amer 50 (*)    GFR calc Af Amer 58 (*)    All other components within normal limits  URINALYSIS, ROUTINE W REFLEX MICROSCOPIC - Abnormal; Notable for the following components:   Glucose, UA 50 (*)    Hgb urine dipstick SMALL (*)    Protein, ur 100 (*)    All other components  within normal limits  CBG MONITORING, ED - Abnormal; Notable for the following components:   Glucose-Capillary 106 (*)    All other components within normal limits  CBC  MAGNESIUM  TROPONIN I (HIGH SENSITIVITY)    EKG EKG Interpretation  Date/Time:  Monday December 08 2019 12:31:18 EDT Ventricular Rate:  91 PR Interval:  164 QRS Duration: 82 QT Interval:  420 QTC Calculation: 516 R Axis:   93 Text Interpretation: Normal sinus rhythm Biatrial enlargement Rightward axis Pulmonary disease pattern Biventricular hypertrophy Cannot rule out Septal infarct , age undetermined new T wave abnormality, consider inferior ischemia new Prolonged  QT Confirmed by Gwyneth Sprout ([id number]) on 12/08/2019 8:03:43 PM   Radiology CT Head Wo Contrast  Result Date: 12/08/2019 CLINICAL DATA:  Dizziness EXAM: CT HEAD WITHOUT CONTRAST TECHNIQUE: Contiguous axial images were obtained from the base of the skull through the vertex without intravenous contrast. COMPARISON:  CT 04/18/2014 FINDINGS: Brain: No acute territorial infarction, hemorrhage, or intracranial mass. Encephalomalacia in the left greater than right frontal lobes. Moderate hypodensity in the white matter. 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 The ventricles are nonenlarged. Vascular: No hyperdense vessels.  Carotid vascular calcification Skull: Left craniotomy.  No acute fracture. Sinuses/Orbits: Mild mucosal thickening in the sinuses. Other: None IMPRESSION: 1. No CT evidence for acute intracranial abnormality. 2. Encephalomalacia in the left greater than right frontal lobe. 3. Moderate bilateral white matter hypodensity, possible age advanced chronic small vessel ischemic change though other white matter disease is not excluded. Further evaluation with MRI could be considered, which could be performed non emergently unless clinical situation dictates otherwise. Electronically Signed   By: Jasmine Pang M.D.   On:  12/08/2019 21:20    Procedures .Critical Care Performed by: Fayrene Helper, PA-C Authorized by: Fayrene Helper, PA-C   Critical care provider statement:    Critical care time (minutes):  31   Critical care was time spent personally by me on the following activities:  Discussions with consultants, evaluation of patient's response to treatment, examination of patient, ordering and performing treatments and interventions, ordering and review of laboratory studies, ordering and review of radiographic studies, pulse oximetry, re-evaluation of patient's condition, obtaining history from patient or surrogate and review of old charts   (including critical care time)  Medications Ordered in ED Medications  sodium chloride 0.9 % bolus 1,000 mL (1,000 mLs Intravenous New Bag/Given 12/08/19 2142)  labetalol (NORMODYNE) injection 10 mg (10 mg Intravenous Given 12/08/19 2141)    ED Course  I have reviewed the triage vital signs and the nursing notes.  Pertinent labs & imaging results that were available during my care of the patient were reviewed by me and considered in my medical decision making (see chart for details).    MDM Rules/Calculators/A&P                          BP (!) 215/147 (BP Location: Right Arm)   Pulse 83   Temp 98.5 F (36.9 C) (Oral)   Resp 16   Ht 5\' 9"  (1.753 m)   Wt 72.6 kg   SpO2 100%   BMI 23.63 kg/m   Final Clinical Impression(s) / ED Diagnoses Final diagnoses:  Malignant hypertension  Dizziness    Rx / DC Orders ED Discharge Orders    None     8:55 PM Patient complains of intermittent dizziness for the past several days.  Seem to be brought on by positional changes.  Was found to be hypertensive with blood pressure 215/147.  Does have history of hypertension but does not take any blood pressure medication.  Furthermore, history of alcohol abuse.  Sister mention patient has had an unsteady gait and has been a bit different from his normal baseline and she is  concerned.  Initial evaluation without any focal neuro deficit.  He is able to ambulate with mild antalgic gait.  Negative Romberg, no pronator drift, no facial droop, able to answer questions appropriately.  9:32 PM UA obtained showed no evidence of UTI.  Labs remarkable for AKI with  creatinine of 1.53, mild hypokalemia with potassium of 3.2.  Will check magnesium level.  Head CT scan obtained showed no CT evidence of acute intracranial abnormalities.  Evidence of encephalomalacia in the left greater than right frontal lobe.  There are also moderate bilateral white matter hypodensity due to possible H events chronic small vessel ischemic change though otherwise moderate disease is not excluded.  Further evaluation with MRI could be considered.  Since patient does not have close follow-up, as well as having elevated blood pressure and unsteady gait, will obtain brain MRI to rule out posterior circulation stroke, secondary to hypertensive emergency.  We will also give IV fluid as patient has abnormal renal function.  This could be due to AKI from dehydration or it could be signs of endorgan damage from hypertensive emergency.  Care discussed with Dr. Anitra Lauth.  9:45 PM Pt sign out to Dr. Anitra Lauth who will f/u on MRI result, reassess and determine disposition.  I also order 10mg  of labetalol for blood pressure control.   Gerald Morgan was evaluated in Emergency Department on 12/08/2019 for the symptoms described in the history of present illness. He was evaluated in the context of the global COVID-19 pandemic, which necessitated consideration that the patient might be at risk for infection with the SARS-CoV-2 virus that causes COVID-19. Institutional protocols and algorithms that pertain to the evaluation of patients at risk for COVID-19 are in a state of rapid change based on information released by regulatory bodies including the CDC and federal and state organizations. These policies and algorithms  were followed during the patient's care in the ED.    12/10/2019, PA-C 12/08/19 2147    2148, MD 12/08/19 2329

## 2019-12-08 NOTE — ED Notes (Signed)
BP 229/141 . Sabrina notified, Assigned Bed 11

## 2019-12-08 NOTE — ED Provider Notes (Signed)
Assumed care at 10:00 from the patient who was waiting for MRI due to dizziness and some coordination issues.  Patient is hypertensive here but also reports that he drinks significant amount of alcohol and sister reports he has not been eating well.  Patient did receive 1 L of fluid.  He was going to use a urinal in the bedside commode when he started to feel short of breath.  I went to evaluate the patient and he was using accessory muscles and sounded wet.  He does have a smoking history but no history of CHF or COPD that requires inhalers.  Patient then quickly deteriorated over the next 15 to 20 minutes.  He became to tachypneic in the 50s with O2 sats dropping in the upper 70s and tachycardic to the 150s.  Patient was placed on 15 L without any improvement.  He was been started on BiPAP.  Blood pressure remained elevated and was 250/130.  Patient was started on a nitroglycerin drip.  Chest x-ray showed pulmonary edema.  Suspect patient went into flash pulmonary edema.  Troponin is elevated at forty-three.  Repeat EKG to ensure no acute ischemic changes.  Patient given IV Lasix.  Approximately 15 minutes after BiPAP was started heart rate is starting to improve.  Oxygen saturation remains in the high 90s.  Patient is mildly agitated with the mask and was given 0.5 mg of Ativan.  11:12 PM Repeat EKG is unchanged.    CRITICAL CARE Performed by: Jamine Highfill Total critical care time: 40 minutes Critical care time was exclusive of separately billable procedures and treating other patients. Critical care was necessary to treat or prevent imminent or life-threatening deterioration. Critical care was time spent personally by me on the following activities: development of treatment plan with patient and/or surrogate as well as nursing, discussions with consultants, evaluation of patient's response to treatment, examination of patient, obtaining history from patient or surrogate, ordering and performing  treatments and interventions, ordering and review of laboratory studies, ordering and review of radiographic studies, pulse oximetry and re-evaluation of patient's condition.    Gwyneth Sprout, MD 12/08/19 2329

## 2019-12-08 NOTE — ED Triage Notes (Signed)
Pt reports new onset of dizziness about a week ago. Pt denies slurred speech, no droop or drift noted. Awake, alert, ambulatory. Pt with elevated bp in triage. States does not normally take medication for blood pressure.

## 2019-12-08 NOTE — ED Notes (Signed)
BP 229/141 . Sa

## 2019-12-08 NOTE — ED Notes (Signed)
2215 Pt asked to use restroom, tech reported that pt stated he was sob while using the urinal. MD notified, new orders received, rt paged.   2230 Pt requesting to have use restroom to have a bm, bedside commode brought to bedside. Pt expressed increased sob at this time and noted to be tripoding. MD notified, rt paged. Bipap initiated, family at bedside updated on plan of care.

## 2019-12-09 ENCOUNTER — Inpatient Hospital Stay (HOSPITAL_COMMUNITY): Payer: Self-pay

## 2019-12-09 ENCOUNTER — Encounter (HOSPITAL_COMMUNITY): Payer: Self-pay | Admitting: Pulmonary Disease

## 2019-12-09 ENCOUNTER — Other Ambulatory Visit: Payer: Self-pay

## 2019-12-09 DIAGNOSIS — J810 Acute pulmonary edema: Secondary | ICD-10-CM

## 2019-12-09 DIAGNOSIS — I160 Hypertensive urgency: Secondary | ICD-10-CM | POA: Diagnosis present

## 2019-12-09 LAB — SARS CORONAVIRUS 2 BY RT PCR (HOSPITAL ORDER, PERFORMED IN ~~LOC~~ HOSPITAL LAB): SARS Coronavirus 2: NEGATIVE

## 2019-12-09 LAB — BASIC METABOLIC PANEL
Anion gap: 9 (ref 5–15)
BUN: 13 mg/dL (ref 6–20)
CO2: 24 mmol/L (ref 22–32)
Calcium: 8.3 mg/dL — ABNORMAL LOW (ref 8.9–10.3)
Chloride: 105 mmol/L (ref 98–111)
Creatinine, Ser: 1.58 mg/dL — ABNORMAL HIGH (ref 0.61–1.24)
GFR calc Af Amer: 56 mL/min — ABNORMAL LOW (ref >60)
GFR calc non Af Amer: 48 mL/min — ABNORMAL LOW (ref >60)
Glucose, Bld: 162 mg/dL — ABNORMAL HIGH (ref 70–99)
Potassium: 3.1 mmol/L — ABNORMAL LOW (ref 3.5–5.1)
Sodium: 138 mmol/L (ref 135–145)

## 2019-12-09 LAB — CBC
HCT: 46.4 % (ref 39.0–52.0)
Hemoglobin: 14.7 g/dL (ref 13.0–17.0)
MCH: 30.2 pg (ref 26.0–34.0)
MCHC: 31.7 g/dL (ref 30.0–36.0)
MCV: 95.3 fL (ref 80.0–100.0)
Platelets: 240 10*3/uL (ref 150–400)
RBC: 4.87 MIL/uL (ref 4.22–5.81)
RDW: 13.7 % (ref 11.5–15.5)
WBC: 9.3 10*3/uL (ref 4.0–10.5)
nRBC: 0 % (ref 0.0–0.2)

## 2019-12-09 LAB — MRSA PCR SCREENING: MRSA by PCR: NEGATIVE

## 2019-12-09 LAB — GLUCOSE, CAPILLARY: Glucose-Capillary: 161 mg/dL — ABNORMAL HIGH (ref 70–99)

## 2019-12-09 LAB — PHOSPHORUS: Phosphorus: 4.1 mg/dL (ref 2.5–4.6)

## 2019-12-09 LAB — RAPID URINE DRUG SCREEN, HOSP PERFORMED
Amphetamines: NOT DETECTED
Barbiturates: NOT DETECTED
Benzodiazepines: NOT DETECTED
Cocaine: NOT DETECTED
Opiates: NOT DETECTED
Tetrahydrocannabinol: POSITIVE — AB

## 2019-12-09 LAB — HIV ANTIBODY (ROUTINE TESTING W REFLEX): HIV Screen 4th Generation wRfx: NONREACTIVE

## 2019-12-09 LAB — MAGNESIUM: Magnesium: 1.8 mg/dL (ref 1.7–2.4)

## 2019-12-09 MED ORDER — FUROSEMIDE 10 MG/ML IJ SOLN
40.0000 mg | Freq: Once | INTRAMUSCULAR | Status: AC
  Administered 2019-12-09: 40 mg via INTRAVENOUS
  Filled 2019-12-09: qty 4

## 2019-12-09 MED ORDER — CHLORHEXIDINE GLUCONATE CLOTH 2 % EX PADS
6.0000 | MEDICATED_PAD | Freq: Every day | CUTANEOUS | Status: DC
  Administered 2019-12-09 – 2019-12-13 (×5): 6 via TOPICAL

## 2019-12-09 MED ORDER — DOCUSATE SODIUM 100 MG PO CAPS: 100.0000 mg | ORAL_CAPSULE | Freq: Two times a day (BID) | ORAL | Status: DC | PRN

## 2019-12-09 MED ORDER — THIAMINE HCL 100 MG PO TABS
100.0000 mg | ORAL_TABLET | Freq: Every day | ORAL | Status: DC
  Administered 2019-12-10 – 2019-12-13 (×4): 100 mg via ORAL
  Filled 2019-12-09 (×4): qty 1

## 2019-12-09 MED ORDER — NICARDIPINE HCL IN NACL 20-0.86 MG/200ML-% IV SOLN
0.0000 mg/h | INTRAVENOUS | Status: DC
  Administered 2019-12-09 (×4): 5 mg/h via INTRAVENOUS
  Administered 2019-12-09: 7.5 mg/h via INTRAVENOUS
  Administered 2019-12-09: 5 mg/h via INTRAVENOUS
  Administered 2019-12-10: 12.5 mg/h via INTRAVENOUS
  Administered 2019-12-10: 7.5 mg/h via INTRAVENOUS
  Administered 2019-12-10: 10 mg/h via INTRAVENOUS
  Administered 2019-12-10: 12.5 mg/h via INTRAVENOUS
  Filled 2019-12-09 (×12): qty 200

## 2019-12-09 MED ORDER — METOPROLOL SUCCINATE 12.5 MG HALF TABLET
12.5000 mg | ORAL_TABLET | Freq: Every day | ORAL | Status: DC
  Administered 2019-12-09: 12.5 mg via ORAL
  Filled 2019-12-09 (×2): qty 1

## 2019-12-09 MED ORDER — HEPARIN SODIUM (PORCINE) 5000 UNIT/ML IJ SOLN
5000.0000 [IU] | Freq: Three times a day (TID) | INTRAMUSCULAR | Status: DC
  Administered 2019-12-10 – 2019-12-13 (×9): 5000 [IU] via SUBCUTANEOUS
  Filled 2019-12-09 (×10): qty 1

## 2019-12-09 MED ORDER — FOLIC ACID 5 MG/ML IJ SOLN
1.0000 mg | Freq: Every day | INTRAMUSCULAR | Status: DC
  Filled 2019-12-09: qty 0.2

## 2019-12-09 MED ORDER — NICOTINE 7 MG/24HR TD PT24
7.0000 mg | MEDICATED_PATCH | Freq: Every day | TRANSDERMAL | Status: DC
  Administered 2019-12-10 – 2019-12-13 (×4): 7 mg via TRANSDERMAL
  Filled 2019-12-09 (×5): qty 1

## 2019-12-09 MED ORDER — AMLODIPINE BESYLATE 10 MG PO TABS
10.0000 mg | ORAL_TABLET | Freq: Every day | ORAL | Status: DC
  Administered 2019-12-09 – 2019-12-10 (×2): 10 mg via ORAL
  Filled 2019-12-09: qty 1
  Filled 2019-12-09: qty 2

## 2019-12-09 MED ORDER — POTASSIUM CHLORIDE CRYS ER 20 MEQ PO TBCR
20.0000 meq | EXTENDED_RELEASE_TABLET | ORAL | Status: AC
  Administered 2019-12-09 (×2): 20 meq via ORAL
  Filled 2019-12-09 (×2): qty 1

## 2019-12-09 MED ORDER — POLYETHYLENE GLYCOL 3350 17 G PO PACK: 17.0000 g | PACK | Freq: Every day | ORAL | Status: DC | PRN

## 2019-12-09 MED ORDER — FOLIC ACID 1 MG PO TABS
1.0000 mg | ORAL_TABLET | Freq: Every day | ORAL | Status: DC
  Administered 2019-12-10 – 2019-12-13 (×4): 1 mg via ORAL
  Filled 2019-12-09 (×4): qty 1

## 2019-12-09 MED ORDER — THIAMINE HCL 100 MG/ML IJ SOLN
100.0000 mg | Freq: Every day | INTRAMUSCULAR | Status: DC
  Administered 2019-12-09: 100 mg via INTRAVENOUS
  Filled 2019-12-09: qty 2

## 2019-12-09 NOTE — Progress Notes (Signed)
NAME:  Gerald Morgan, MRN:  [id number], DOB:  Jan 27, 1961, LOS: 0 ADMISSION DATE:  12/08/2019, CONSULTATION DATE: 12/09/2019 REFERRING MD: Redge Gainer, ED, CHIEF COMPLAINT: Dizziness and dyspnea  Brief History   Hypertension 59 year old male brought to the emergency room developed worsening dyspnea requiring BiPAP what appears to be flash pulmonary edema  History of present illness   Patient is a 59 year old male brought to the emergency room with complaints of dizziness and difficult coordination.  He is a regular drinker.  He received a liter of IV fluid the emergency room and developed worsening dyspnea with decrease in oxygen saturation requiring BiPAP.  He was placed on nitroglycerin drip.  Chest x-ray around that time showed vascular congestion and increased interstitial edema.  He did receive a dose of IV Lasix in the emergency room.  His creatinine is 1.57. At time of my evaluation the patient remains on BiPAP 100% with O2 saturation 99%.  His troponin is 44.  EKG shows no ST segment elevations.  He is able to converse without difficulty in full sentences while on BiPAP.  He reports that he is on no medication regularly for hypertension has a known diagnosis of hypertension.  He has had 2 craniotomies for intracranial hemorrhage in the past.  Past Medical History  Hypertension Patient denies history of myocardial infarction or cardiac disease he has not seen a physician in years other than admissions for subdural hematomas.  Significant Hospital Events   Admission [facility] 12/09/2019  Consults:  PCCM  Procedures:  NA  Significant Diagnostic Tests:  As above  Micro Data:  NA  Antimicrobials:  NA  Interim history/subjective:  NA  Objective   Blood pressure (!) 159/103, pulse (!) 103, temperature 98.5 F (36.9 C), temperature source Oral, resp. rate (!) 24, height 5\' 9"  (1.753 m), weight 72.6 kg, SpO2 100 %.    FiO2 (%):  [80 %-100 %] 80 %  No intake or output  data in the 24 hours ending 12/09/19 0842 Filed Weights   12/08/19 1228  Weight: 72.6 kg    Examination: General: Well-nourished well-developed male no acute distress on 2 L nasal cannula with adequate saturations HEENT: No JVD or lymphadenopathy is appreciated Neuro:  CV: Heart sounds are regular regular rhythm PULM: Bibasilar crackles  GI: soft, bsx4 active  GU: Voids Extremities: warm/dry,  edema  Skin: no rashes or lesions   Resolved Hospital Problem list   NA  Assessment & Plan:   Hypertensive emergency:  Remains on Cardene drip Oral antihypertensives have been ordered but not started as of time of this dictation.  I suspect once he is gets his p.o. medications coupled with another dose of Lasix he should be able to come off Cardene drip. Once off Cardene can go to stepdown unit with hospitalist  Renal insufficiency Lab Results  Component Value Date   CREATININE 1.58 (H) 12/09/2019   CREATININE 1.53 (H) 12/08/2019   CREATININE 0.70 04/17/2014  Continue gentle diuresis  monitor creatinine Avoid nephrotoxins  Hypoxic respiratory failure quickly resolving with diuresis and blood pressure control. Currently on 2 L nasal cannula with adequate saturations Repeat gentle diuresis  Tobacco or marijuana abuse Counseling suggested  Best practice:  Diet: Clear liquids Pain/Anxiety/Delirium protocol (if indicated): N/A VAP protocol (if indicated): N/A DVT prophylaxis: Lovenox GI prophylaxis: N/A Glucose control: Monitor Mobility: Bedrest Code Status: Full Family Communication: Discussed with patient Disposition: To ICU for IV Cardene and BiPAP management.  Labs   CBC: Recent Labs  Lab 12/08/19 1229 12/09/19 0052  WBC 6.5 9.3  HGB 14.1 14.7  HCT 43.2 46.4  MCV 93.1 95.3  PLT 244 240    Basic Metabolic Panel: Recent Labs  Lab 12/08/19 1229 12/08/19 2135 12/09/19 0052  NA 138  --  138  K 3.2*  --  3.1*  CL 101  --  105  CO2 27  --  24  GLUCOSE 109*   --  162*  BUN 11  --  13  CREATININE 1.53*  --  1.58*  CALCIUM 8.8*  --  8.3*  MG  --  2.0 1.8  PHOS  --   --  4.1   GFR: Estimated Creatinine Clearance: 52.2 mL/min (A) (by C-G formula based on SCr of 1.58 mg/dL (H)). Recent Labs  Lab 12/08/19 1229 12/09/19 0052  WBC 6.5 9.3    Liver Function Tests: No results for input(s): AST, ALT, ALKPHOS, BILITOT, PROT, ALBUMIN in the last 168 hours. No results for input(s): LIPASE, AMYLASE in the last 168 hours. No results for input(s): AMMONIA in the last 168 hours.  ABG    Component Value Date/Time   TCO2 21 04/17/2014 1507     Coagulation Profile: No results for input(s): INR, PROTIME in the last 168 hours.  Cardiac Enzymes: No results for input(s): CKTOTAL, CKMB, CKMBINDEX, TROPONINI in the last 168 hours.  HbA1C: No results found for: HGBA1C  CBG: Recent Labs  Lab 12/08/19 1232  GLUCAP 106*     App cct 35 min  Brett Canales Jiaire Rosebrook ACNP Acute Care Nurse Practitioner [facility] Pulmonary/Critical Care Please consult Amion 12/09/2019, 8:43 AM

## 2019-12-09 NOTE — Progress Notes (Signed)
patient titrated off NIV, patient is now on a nasal cannula at 2L saturations are stable. No increase WOB or SOB noted.

## 2019-12-09 NOTE — H&P (Signed)
NAME:  Gerald Morgan, MRN:  [id number], DOB:  03-May-1960, LOS: 0 ADMISSION DATE:  12/08/2019, CONSULTATION DATE: 12/09/2019 REFERRING MD: Redge Gainer, ED, CHIEF COMPLAINT: Dizziness and dyspnea  Brief History   Hypertension 59 year old male brought to the emergency room developed worsening dyspnea requiring BiPAP what appears to be flash pulmonary edema  History of present illness   Patient is a 59 year old male brought to the emergency room with complaints of dizziness and difficult coordination.  He is a regular drinker.  He received a liter of IV fluid the emergency room and developed worsening dyspnea with decrease in oxygen saturation requiring BiPAP.  He was placed on nitroglycerin drip.  Chest x-ray around that time showed vascular congestion and increased interstitial edema.  He did receive a dose of IV Lasix in the emergency room.  His creatinine is 1.57. At time of my evaluation the patient remains on BiPAP 100% with O2 saturation 99%.  His troponin is 44.  EKG shows no ST segment elevations.  He is able to converse without difficulty in full sentences while on BiPAP.  He reports that he is on no medication regularly for hypertension has a known diagnosis of hypertension.  He has had 2 craniotomies for intracranial hemorrhage in the past.  Past Medical History  Hypertension Patient denies history of myocardial infarction or cardiac disease he has not seen a physician in years other than admissions for subdural hematomas.  Significant Hospital Events   Admission [facility] 12/09/2019  Consults:  PCCM  Procedures:  NA  Significant Diagnostic Tests:  As above  Micro Data:  NA  Antimicrobials:  NA  Interim history/subjective:  NA  Objective   Blood pressure (!) 231/209, pulse 100, temperature 98.5 F (36.9 C), temperature source Oral, resp. rate (!) 38, height 5\' 9"  (1.753 m), weight 72.6 kg, SpO2 96 %.       No intake or output data in the 24 hours ending  12/09/19 0018 Filed Weights   12/08/19 1228  Weight: 72.6 kg    Examination: General: Thin white male alert oriented on BiPAP HENT: Within normal limits with jugular distention Lungs: Bibasilar crackles Cardiovascular: Regular rate and rhythm Abdomen: Benign bowel sounds positive Extremities: Within normal limits no cyanosis clubbing or Neuro: Awake alert nonfocal GU: Within normal limits  Resolved Hospital Problem list   NA  Assessment & Plan:  1.  Hypertensive emergency: Patient has been status changes including dizziness earlier.  He currently is in pulmonary edema.  We will gently diurese along with initiation of Cardene for afterload reduction.  Discontinue nitroglycerin drip.  2.  Probable chronic kidney disease with creatinine of 1.56.  Will observe for now  3.  Hypoxemic respiratory failure with new BiPAP start.  Will switch to high flow oxygen if were able to turn FiO2 of BiPAP down significantly.  Best practice:  Diet: Clear liquids Pain/Anxiety/Delirium protocol (if indicated): N/A VAP protocol (if indicated): N/A DVT prophylaxis: Lovenox GI prophylaxis: N/A Glucose control: Monitor Mobility: Bedrest Code Status: Full Family Communication: Discussed with patient Disposition: To ICU for IV Cardene and BiPAP management.  Labs   CBC: Recent Labs  Lab 12/08/19 1229  WBC 6.5  HGB 14.1  HCT 43.2  MCV 93.1  PLT 244    Basic Metabolic Panel: Recent Labs  Lab 12/08/19 1229 12/08/19 2135  NA 138  --   K 3.2*  --   CL 101  --   CO2 27  --   GLUCOSE 109*  --  BUN 11  --   CREATININE 1.53*  --   CALCIUM 8.8*  --   MG  --  2.0   GFR: Estimated Creatinine Clearance: 53.9 mL/min (A) (by C-G formula based on SCr of 1.53 mg/dL (H)). Recent Labs  Lab 12/08/19 1229  WBC 6.5    Liver Function Tests: No results for input(s): AST, ALT, ALKPHOS, BILITOT, PROT, ALBUMIN in the last 168 hours. No results for input(s): LIPASE, AMYLASE in the last 168  hours. No results for input(s): AMMONIA in the last 168 hours.  ABG    Component Value Date/Time   TCO2 21 04/17/2014 1507     Coagulation Profile: No results for input(s): INR, PROTIME in the last 168 hours.  Cardiac Enzymes: No results for input(s): CKTOTAL, CKMB, CKMBINDEX, TROPONINI in the last 168 hours.  HbA1C: No results found for: HGBA1C  CBG: Recent Labs  Lab 12/08/19 1232  GLUCAP 106*    Review of Systems:   Question 14 points in detail found to be unremarkable  Past Medical History  He,  has a past medical history of Hypertension.   Surgical History    Past Surgical History:  Procedure Laterality Date  . BRAIN SURGERY    . CRANIOTOMY  09/19/2011   Procedure: CRANIOTOMY HEMATOMA EVACUATION EPIDURAL;  Surgeon: Barnett Abu, MD;  Location: [facility];  Service: Neurosurgery;  Laterality: Left;  Redo craniotomy for evacuation of epidural hematoma  . CRANIOTOMY Left 04/17/2014   Procedure: CRANIOTOMY HEMATOMA EVACUATION SUBDURAL;  Surgeon: Tressie Stalker, MD;  Location: [facility];  Service: Neurosurgery;  Laterality: Left;  left     Social History   reports that he has been smoking cigarettes. He has been smoking about 0.50 packs per day. He has never used smokeless tobacco. He reports current alcohol use. He reports that he does not use drugs.   Family History   His family history is not on file.   Allergies No Known Allergies   Home Medications  Prior to Admission medications   Medication Sig Start Date End Date Taking? Authorizing Provider  Aspirin-Acetaminophen-Caffeine (GOODY HEADACHE PO) Take 2 packets by mouth as needed (for headaches).   Yes [provider]  docusate sodium 100 MG CAPS Take 100 mg by mouth 2 (two) times daily. Patient not taking: Reported on 12/08/2019 04/22/14   Tressie Stalker, MD  HYDROcodone-acetaminophen (NORCO/VICODIN) 5-325 MG per tablet Take 1 tablet by mouth every 4 (four) hours as needed for moderate  pain. Patient not taking: Reported on 12/08/2019 04/22/14   Tressie Stalker, MD  lisinopril (PRINIVIL,ZESTRIL) 10 MG tablet Take 10 mg by mouth daily. Patient not taking: Reported on 12/08/2019    [provider]  promethazine (PHENERGAN) 25 MG tablet Take 1 tablet (25 mg total) by mouth every 6 (six) hours as needed for nausea. Patient not taking: Reported on 12/08/2019 12/20/11 12/08/19  Geoffery Lyons, MD     Critical care time: 35 minutes spent in chart review, bedside evaluation, critical care planning was

## 2019-12-10 ENCOUNTER — Inpatient Hospital Stay (HOSPITAL_COMMUNITY): Payer: Self-pay

## 2019-12-10 DIAGNOSIS — I10 Essential (primary) hypertension: Secondary | ICD-10-CM

## 2019-12-10 LAB — CBC WITH DIFFERENTIAL/PLATELET
Abs Immature Granulocytes: 0.03 10*3/uL (ref 0.00–0.07)
Basophils Absolute: 0.1 10*3/uL (ref 0.0–0.1)
Basophils Relative: 1 %
Eosinophils Absolute: 0.3 10*3/uL (ref 0.0–0.5)
Eosinophils Relative: 3 %
HCT: 41.7 % (ref 39.0–52.0)
Hemoglobin: 13.9 g/dL (ref 13.0–17.0)
Immature Granulocytes: 0 %
Lymphocytes Relative: 28 %
Lymphs Abs: 2.6 10*3/uL (ref 0.7–4.0)
MCH: 31 pg (ref 26.0–34.0)
MCHC: 33.3 g/dL (ref 30.0–36.0)
MCV: 92.9 fL (ref 80.0–100.0)
Monocytes Absolute: 0.9 10*3/uL (ref 0.1–1.0)
Monocytes Relative: 9 %
Neutro Abs: 5.3 10*3/uL (ref 1.7–7.7)
Neutrophils Relative %: 59 %
Platelets: 254 10*3/uL (ref 150–400)
RBC: 4.49 MIL/uL (ref 4.22–5.81)
RDW: 13.6 % (ref 11.5–15.5)
WBC: 9.1 10*3/uL (ref 4.0–10.5)
nRBC: 0 % (ref 0.0–0.2)

## 2019-12-10 LAB — MAGNESIUM: Magnesium: 1.8 mg/dL (ref 1.7–2.4)

## 2019-12-10 LAB — BASIC METABOLIC PANEL
Anion gap: 10 (ref 5–15)
BUN: 14 mg/dL (ref 6–20)
CO2: 26 mmol/L (ref 22–32)
Calcium: 8.5 mg/dL — ABNORMAL LOW (ref 8.9–10.3)
Chloride: 103 mmol/L (ref 98–111)
Creatinine, Ser: 2.1 mg/dL — ABNORMAL HIGH (ref 0.61–1.24)
GFR calc Af Amer: 40 mL/min — ABNORMAL LOW (ref >60)
GFR calc non Af Amer: 34 mL/min — ABNORMAL LOW (ref >60)
Glucose, Bld: 114 mg/dL — ABNORMAL HIGH (ref 70–99)
Potassium: 3 mmol/L — ABNORMAL LOW (ref 3.5–5.1)
Sodium: 139 mmol/L (ref 135–145)

## 2019-12-10 LAB — PHOSPHORUS: Phosphorus: 2.5 mg/dL (ref 2.5–4.6)

## 2019-12-10 MED ORDER — HYDRALAZINE HCL 20 MG/ML IJ SOLN
10.0000 mg | INTRAMUSCULAR | Status: DC | PRN
  Administered 2019-12-10 – 2019-12-13 (×7): 10 mg via INTRAVENOUS
  Filled 2019-12-10 (×8): qty 1

## 2019-12-10 MED ORDER — POTASSIUM CHLORIDE CRYS ER 20 MEQ PO TBCR
20.0000 meq | EXTENDED_RELEASE_TABLET | ORAL | Status: AC
  Administered 2019-12-10 (×2): 20 meq via ORAL
  Filled 2019-12-10 (×2): qty 1

## 2019-12-10 MED ORDER — ATORVASTATIN CALCIUM 40 MG PO TABS
40.0000 mg | ORAL_TABLET | Freq: Every day | ORAL | Status: DC
  Administered 2019-12-10 – 2019-12-12 (×3): 40 mg via ORAL
  Filled 2019-12-10 (×3): qty 1

## 2019-12-10 MED ORDER — ASPIRIN 81 MG PO CHEW
81.0000 mg | CHEWABLE_TABLET | Freq: Every day | ORAL | Status: DC
  Administered 2019-12-10 – 2019-12-13 (×4): 81 mg via ORAL
  Filled 2019-12-10 (×4): qty 1

## 2019-12-10 MED ORDER — METOPROLOL SUCCINATE ER 25 MG PO TB24
25.0000 mg | ORAL_TABLET | Freq: Every day | ORAL | Status: DC
  Administered 2019-12-10: 25 mg via ORAL
  Filled 2019-12-10 (×2): qty 1

## 2019-12-10 NOTE — Progress Notes (Signed)
eLink Physician-Brief Progress Note Patient Name: Gerald Morgan DOB: [birth year] MRN: [id number]   Date of Service  12/10/2019  HPI/Events of Note  Hypertension - BP = 161/109. Currently on Amlodipine and Metoprolol PO.   eICU Interventions  Plan: 1. Hydralazine 10 mg IV Q 4 hours PRN SBP > 160 or DBP > 100.     Intervention Category Major Interventions: Hypertension - evaluation and management  Lenell Antu 12/10/2019, 9:46 PM

## 2019-12-10 NOTE — Progress Notes (Signed)
Unable to replace electrolytes d/t renal insufficiency  Creatinine 2.1, GFR 40

## 2019-12-10 NOTE — Progress Notes (Signed)
NAME:  Gerald Morgan, MRN:  [id number], DOB:  16-Sep-1960, LOS: 1 ADMISSION DATE:  12/08/2019, CONSULTATION DATE: 12/09/2019 REFERRING MD: Redge Gainer, ED, CHIEF COMPLAINT: Dizziness and dyspnea  Brief History   Hypertension 59 year old male brought to the emergency room developed worsening dyspnea requiring BiPAP what appears to be flash pulmonary edema.  History of present illness   Patient is a 59 year old male brought to the emergency room with complaints of dizziness and difficult coordination.  He is a regular drinker.  He received a liter of IV fluid the emergency room and developed worsening dyspnea with decrease in oxygen saturation requiring BiPAP.  He was placed on nitroglycerin drip.  Chest x-ray around that time showed vascular congestion and increased interstitial edema.  He did receive a dose of IV Lasix in the emergency room.  His creatinine is 1.57. At time of my evaluation the patient remains on BiPAP 100% with O2 saturation 99%.  His troponin is 44.  EKG shows no ST segment elevations.  He is able to converse without difficulty in full sentences while on BiPAP. He reports that he is on no medication regularly for hypertension has a known diagnosis of hypertension.  He has had 2 craniotomies for intracranial hemorrhage in the past.  Past Medical History  Hypertension Patient denies history of myocardial infarction or cardiac disease he has not seen a physician in years other than admissions for subdural hematomas.  Significant Hospital Events   Admission [facility] 12/09/2019  Consults:  PCCM  Procedures:  NA  Significant Diagnostic Tests:  8/24 CXR> pulmonary congestion 8/25 CXR> No active disease  Micro Data:  8/24 MRSA> Negative 8/24 Covid> Negative  Antimicrobials:  NA  Interim history/subjective:  NA  Objective   Blood pressure 135/88, pulse 81, temperature 97.8 F (36.6 C), temperature source Oral, resp. rate 17, height 5\' 9"  (1.753 m), weight  72.5 kg, SpO2 96 %.        Intake/Output Summary (Last 24 hours) at 12/10/2019 0752 Last data filed at 12/10/2019 0600 Gross per 24 hour  Intake 1784.03 ml  Output 2100 ml  Net -315.97 ml   Filed Weights   12/08/19 1228 12/10/19 0500  Weight: 72.6 kg 72.5 kg    Examination: Physical Exam Constitutional:      General: He is not in acute distress.    Appearance: He is normal weight. He is not ill-appearing.     Comments: Alert, awake, NAD, answers questions appropriately.   HENT:     Head: Normocephalic and atraumatic.  Cardiovascular:     Rate and Rhythm: Normal rate and regular rhythm.     Pulses: Normal pulses.     Heart sounds: Normal heart sounds. No murmur heard.  No friction rub. No gallop.   Pulmonary:     Effort: Pulmonary effort is normal.     Breath sounds: Normal breath sounds. No wheezing, rhonchi or rales.  Abdominal:     General: Abdomen is flat. Bowel sounds are normal.     Tenderness: There is no abdominal tenderness. There is no guarding.  Musculoskeletal:     Right lower leg: No edema.     Left lower leg: No edema.  Neurological:     Mental Status: He is alert and oriented to person, place, and time.      Resolved Hospital Problem list   Hypoxic Respiratory Failure: Weaned off [place] to RA  Assessment & Plan:   Hypertensive Emergency:  Patient continues to remain on Cardene  drip. Pressures have come down to the 136-159, he has no complaints this morning, currently on Norvasc 10 mg and metoprolol 12.5 mg. Increase in his creatinine today to 2.01 likely unrelated to his hypertension given that he was given total 80 mg IV lasix with 2.1L of urine. Scr increased to 2.01, pulmonary edema not present on examination today. MRI has findings for L punctate MCA infarcts. - Goal systolic >140  - Increase metoprolol 25 mg QD - Continue Norvasc  - Wean off Cardene drip and transfer to the floor.  Renal insufficiency Lab Results  Component Value Date    CREATININE 2.10 (H) 12/10/2019   CREATININE 1.58 (H) 12/09/2019   CREATININE 1.53 (H) 12/08/2019   - Avoid nephrotoxic agents  L MCA Punctate Stroke:  MRI Imaging ON showed L punctate cerebral infarcts in the MCA territory likely 2/2 to his hypertensive emergency.  - Started ASA 81 mg - Lipitor 40 mg  - Will reach out to Neurology for consultation.   Best practice:  Diet: Clear liquids Pain/Anxiety/Delirium protocol (if indicated): N/A VAP protocol (if indicated): N/A DVT prophylaxis: Lovenox GI prophylaxis: N/A Glucose control: Monitor Mobility: Bedrest Code Status: Full Family Communication: Discussed with patient Disposition: To ICU for IV Cardene and BiPAP management.  Labs   CBC: Recent Labs  Lab 12/08/19 1229 12/09/19 0052 12/10/19 0032  WBC 6.5 9.3 9.1  NEUTROABS  --   --  5.3  HGB 14.1 14.7 13.9  HCT 43.2 46.4 41.7  MCV 93.1 95.3 92.9  PLT 244 240 254    Basic Metabolic Panel: Recent Labs  Lab 12/08/19 1229 12/08/19 2135 12/09/19 0052 12/10/19 0032  NA 138  --  138 139  K 3.2*  --  3.1* 3.0*  CL 101  --  105 103  CO2 27  --  24 26  GLUCOSE 109*  --  162* 114*  BUN 11  --  13 14  CREATININE 1.53*  --  1.58* 2.10*  CALCIUM 8.8*  --  8.3* 8.5*  MG  --  2.0 1.8 1.8  PHOS  --   --  4.1 2.5   GFR: Estimated Creatinine Clearance: 39.3 mL/min (A) (by C-G formula based on SCr of 2.1 mg/dL (H)). Recent Labs  Lab 12/08/19 1229 12/09/19 0052 12/10/19 0032  WBC 6.5 9.3 9.1    Liver Function Tests: No results for input(s): AST, ALT, ALKPHOS, BILITOT, PROT, ALBUMIN in the last 168 hours. No results for input(s): LIPASE, AMYLASE in the last 168 hours. No results for input(s): AMMONIA in the last 168 hours.  ABG    Component Value Date/Time   TCO2 21 04/17/2014 1507     Coagulation Profile: No results for input(s): INR, PROTIME in the last 168 hours.  Cardiac Enzymes: No results for input(s): CKTOTAL, CKMB, CKMBINDEX, TROPONINI in the last 168  hours.  HbA1C: No results found for: HGBA1C  CBG: Recent Labs  Lab 12/08/19 1232 12/09/19 1200  GLUCAP 106* 161*     Dolan Amen, MD IMTS, PGY-2 12/10/2019,7:53 AM

## 2019-12-11 DIAGNOSIS — I161 Hypertensive emergency: Secondary | ICD-10-CM

## 2019-12-11 DIAGNOSIS — J9601 Acute respiratory failure with hypoxia: Secondary | ICD-10-CM

## 2019-12-11 DIAGNOSIS — N1831 Chronic kidney disease, stage 3a: Secondary | ICD-10-CM

## 2019-12-11 LAB — COMPREHENSIVE METABOLIC PANEL
ALT: 10 U/L (ref 0–44)
AST: 18 U/L (ref 15–41)
Albumin: 3.6 g/dL (ref 3.5–5.0)
Alkaline Phosphatase: 74 U/L (ref 38–126)
Anion gap: 9 (ref 5–15)
BUN: 14 mg/dL (ref 6–20)
CO2: 24 mmol/L (ref 22–32)
Calcium: 9.1 mg/dL (ref 8.9–10.3)
Chloride: 106 mmol/L (ref 98–111)
Creatinine, Ser: 1.99 mg/dL — ABNORMAL HIGH (ref 0.61–1.24)
GFR calc Af Amer: 42 mL/min — ABNORMAL LOW (ref >60)
GFR calc non Af Amer: 36 mL/min — ABNORMAL LOW (ref >60)
Glucose, Bld: 102 mg/dL — ABNORMAL HIGH (ref 70–99)
Potassium: 4 mmol/L (ref 3.5–5.1)
Sodium: 139 mmol/L (ref 135–145)
Total Bilirubin: 0.6 mg/dL (ref 0.3–1.2)
Total Protein: 7.2 g/dL (ref 6.5–8.1)

## 2019-12-11 LAB — CBC WITH DIFFERENTIAL/PLATELET
Abs Immature Granulocytes: 0.02 10*3/uL (ref 0.00–0.07)
Basophils Absolute: 0.1 10*3/uL (ref 0.0–0.1)
Basophils Relative: 1 %
Eosinophils Absolute: 0.2 10*3/uL (ref 0.0–0.5)
Eosinophils Relative: 2 %
HCT: 45.1 % (ref 39.0–52.0)
Hemoglobin: 14.9 g/dL (ref 13.0–17.0)
Immature Granulocytes: 0 %
Lymphocytes Relative: 22 %
Lymphs Abs: 1.4 10*3/uL (ref 0.7–4.0)
MCH: 30.8 pg (ref 26.0–34.0)
MCHC: 33 g/dL (ref 30.0–36.0)
MCV: 93.4 fL (ref 80.0–100.0)
Monocytes Absolute: 1 10*3/uL (ref 0.1–1.0)
Monocytes Relative: 15 %
Neutro Abs: 3.7 10*3/uL (ref 1.7–7.7)
Neutrophils Relative %: 60 %
Platelets: 307 10*3/uL (ref 150–400)
RBC: 4.83 MIL/uL (ref 4.22–5.81)
RDW: 13.9 % (ref 11.5–15.5)
WBC: 6.3 10*3/uL (ref 4.0–10.5)
nRBC: 0 % (ref 0.0–0.2)

## 2019-12-11 LAB — PHOSPHORUS: Phosphorus: 2.2 mg/dL — ABNORMAL LOW (ref 2.5–4.6)

## 2019-12-11 LAB — MAGNESIUM: Magnesium: 1.9 mg/dL (ref 1.7–2.4)

## 2019-12-11 MED ORDER — AMLODIPINE BESYLATE 10 MG PO TABS
10.0000 mg | ORAL_TABLET | Freq: Every day | ORAL | Status: DC
  Administered 2019-12-11 – 2019-12-13 (×3): 10 mg via ORAL
  Filled 2019-12-11 (×3): qty 1

## 2019-12-11 MED ORDER — ADULT MULTIVITAMIN W/MINERALS CH
1.0000 | ORAL_TABLET | Freq: Every day | ORAL | Status: DC
  Administered 2019-12-12 – 2019-12-13 (×3): 1 via ORAL
  Filled 2019-12-11 (×3): qty 1

## 2019-12-11 MED ORDER — K PHOS MONO-SOD PHOS DI & MONO 155-852-130 MG PO TABS
500.0000 mg | ORAL_TABLET | Freq: Two times a day (BID) | ORAL | Status: DC
  Filled 2019-12-11 (×2): qty 2

## 2019-12-11 MED ORDER — METOPROLOL SUCCINATE ER 50 MG PO TB24
50.0000 mg | ORAL_TABLET | Freq: Every day | ORAL | Status: DC
  Administered 2019-12-12: 50 mg via ORAL
  Filled 2019-12-11: qty 1

## 2019-12-11 MED ORDER — METOPROLOL SUCCINATE ER 25 MG PO TB24
25.0000 mg | ORAL_TABLET | Freq: Every day | ORAL | Status: DC
  Administered 2019-12-11: 25 mg via ORAL
  Filled 2019-12-11: qty 1

## 2019-12-11 MED ORDER — CLOPIDOGREL BISULFATE 75 MG PO TABS
75.0000 mg | ORAL_TABLET | Freq: Every day | ORAL | Status: DC
Start: 2019-12-12 — End: 2019-12-11

## 2019-12-11 NOTE — Progress Notes (Signed)
PROGRESS NOTE    Gerald Morgan  TFT:[id number] DOB: August 02, 1960 DOA: 12/08/2019 PCP: Patient, No Pcp Per  Brief Narrative:  The patient is a 59 year old African-American male who is a regular drinker with a past medical history of hypertension who does not take any blood pressure medications, as well as a history of 2 craniotomies for intracranial hemorrhage in the past, who presented with acute onset of dizziness and difficult coordination.  He received a liter of fluid in the ED and began to develop worsening dyspnea with decreased oxygen saturation requiring BiPAP.  His subsequently placed on nitroglycerin drip and chest x-ray at that time showed vascular congestion and increased interstitial edema.  He then subsequently was given a dose of IV Lasix.  He subsequently was in flash pulmonary edema after diuresis improved.  Blood pressure was significantly still elevated so his nitroglycerin drip was stopped and he was to changed to a Cardene drip.  He subsequently started on p.o. medications and his blood pressure improved and he was transitioned out of the ICU after he is weaned off the Cardene drip.  His head CT scan was done initially and showed no evidence of any acute intracranial abnormalities but did show encephalomalacia in the left greater than the right frontal lobe as well as moderate bilateral white white matter hypodensity which was further evaluated with an MRI.  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  He was noted to have anterior inferior frontal encephalomalacia sequela of extra-axial hemorrhage as well.  Neurology was was consulted and will see the patient in the a.m.  His blood pressure still remains a little high and PT OT still pending to see the patient.  Assessment & Plan:   Active Problems:   Hypertensive urgency  Hypertensive  emergency complicated with pulmonary edema -Subsequently went into flash pulmonary edema had to be placed on BiPAP -He was on a Cardene drip yesterday and pressures had improved -Continue amlodipine 10 mg p.o. daily as well as metoprolol 25 mg p.o. twice daily -He has hydralazine 10 mg every 4 hours as needed for systolic blood pressure being 706 or diastolic blood pressure greater than 100 -Continue to monitor blood pressures per protocol and goal blood pressure is to maintain a systolic less than 160  Acute respiratory failure with hypoxia in the setting of flash pulmonary edema from hypertensive emergency  -Placed on BiPAP initially and had been weaned off after diuresis -Patient feels much better and he was given IV Lasix 80 mg total yesterday with increase of his serum creatinine from 1.58 -> 2.01 -Further diuretics have been held  Renal Insufficiency but likely has some component of chronic kidney disease given his history of uncontrolled hypertension; Suspect CKD Stage 3a -Suspecting that his kidney disease is stage IIIa -Currently his BUN/creatinine had worsened after he was given diuretics and went from 13/1.58 -> 14/2.10 -> 14/1.99 today -Avoid further nephrotoxic medications, contrast dyes, hypotension and Renally adjust medications -Repeat CMP in the AM   Hypophosphatemia -Patient's phosphorus morning was 2.2 -Replete with p.o. 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 Advanced chronic microvascular ischemic changes.  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 Anterior inferior left frontal encephalomalacia. Sequelae of left extra-axial hemorrhage -PT/OT to evaluate and  Treat -SLP cleared the patient for a Regular Diet  -Patient was placed on aspirin 81 mg and atorvastatin and the ICU team stated they consulted neurology but neurology did not formally get this consult so we have reconsulted and Dr. Roda ShuttersXu will see the patient in the morning -Further work-up to neurology and will defer echocardiogram, carotid Dopplers, CTA of the head and neck to them along with other studies -Continue to Monitor on Telemetry   Hypokalemia -The patient's potassium is improved and is improved and went from 3.0 -> 4.0 -Continue to monitor output as necessary  -Repeat CMP in a.m.  History of significant alcohol use  -Continue to monitor for signs and symptoms of withdrawal; currently no withdrawal symptoms noted -Continue with folic acid 1 mg p.o. daily, and thiamine -We will add multivitamin with minerals   DVT prophylaxis: Heparin 5,000 units sq q8h Code Status: FULL CODE  Family Communication: Discussed with family at bedside  Disposition Plan: Pending further evaluation by PT/OT and Final Recc's from Neurology   Status is: Inpatient  Remains inpatient appropriate because:Unsafe d/c plan, IV treatments appropriate due to intensity of illness or inability to take PO and Inpatient level of care appropriate due to severity of illness   Dispo: The patient is from: Home              Anticipated d/c is to: TBD              Anticipated d/c date is: 1 day              Patient currently is not medically stable to d/c.  Consultants:   PCCM Transfer  Neurology    Procedures: None  Antimicrobials:  Anti-infectives (From admission, onward)   None     Subjective: Seen and examined at bedside and the patient was feeling much better today.  No chest pain, lightheadedness or dizziness.  No other concerns or complaints at this time and was hungry.  Objective: Vitals:   12/11/19 1129 12/11/19 1538 12/11/19 1727 12/11/19 1930  BP: (!) 148/107 (!) 164/112 (!) 172/102 (!)  170/100  Pulse: 76 72  81  Resp: 16 16  19   Temp: 98.5 F (36.9 C) (!) 97.5 F (36.4 C)  98.1 F (36.7 C)  TempSrc: Oral Oral  Oral  SpO2: 100% 100%  99%  Weight:      Height:        Intake/Output Summary (Last 24 hours) at 12/11/2019 2017 Last data filed at 12/11/2019 0930 Gross per 24 hour  Intake 240 ml  Output --  Net 240 ml   Filed Weights   12/08/19 1228 12/10/19 0500  Weight: 72.6 kg 72.5 kg   Examination: Physical Exam:  Constitutional: WN/WD African-American male currently in NAD and appears calm and comfortable Eyes: Lids and conjunctivae normal, sclerae anicteric  ENMT: External Ears, Nose appear normal. Grossly normal hearing. Neck: Appears normal, supple, no cervical masses, normal ROM, no appreciable thyromegaly; no JVD Respiratory: Diminished to auscultation bilaterally, no wheezing, rales, rhonchi or crackles. Normal respiratory effort and patient is not tachypenic. No accessory muscle use. Unlabored Breathing  Cardiovascular: RRR, no murmurs / rubs / gallops. S1 and S2 auscultated. No extremity edema.  Abdomen: Soft, non-tender, non-distended. Bowel sounds positive.  GU: Deferred. Musculoskeletal: No clubbing / cyanosis of digits/nails. No joint deformity upper and lower extremities.  Skin: No rashes, lesions, ulcers on limited skin evaluation. No induration; Warm and dry.  Neurologic: CN 2-12 grossly intact with no focal deficits.  Romberg sign cerebellar reflexes not assessed.  Psychiatric: Normal judgment and insight. Alert and oriented x 3. Normal mood and appropriate affect.   Data Reviewed: I have personally reviewed following labs and imaging studies  CBC: Recent Labs  Lab 12/08/19 1229 12/09/19 0052 12/10/19 0032 12/11/19 0918  WBC 6.5 9.3 9.1 6.3  NEUTROABS  --   --  5.3 3.7  HGB 14.1 14.7 13.9 14.9  HCT 43.2 46.4 41.7 45.1  MCV 93.1 95.3 92.9 93.4  PLT 244 240 254 307   Basic Metabolic Panel: Recent Labs  Lab 12/08/19 1229  12/08/19 2135 12/09/19 0052 12/10/19 0032 12/11/19 0918  NA 138  --  138 139 139  K 3.2*  --  3.1* 3.0* 4.0  CL 101  --  105 103 106  CO2 27  --  24 26 24   GLUCOSE 109*  --  162* 114* 102*  BUN 11  --  13 14 14   CREATININE 1.53*  --  1.58* 2.10* 1.99*  CALCIUM 8.8*  --  8.3* 8.5* 9.1  MG  --  2.0 1.8 1.8 1.9  PHOS  --   --  4.1 2.5 2.2*   GFR: Estimated Creatinine Clearance: 41.4 mL/min (A) (by C-G formula based on SCr of 1.99 mg/dL (H)). Liver Function Tests: Recent Labs  Lab 12/11/19 0918  AST 18  ALT 10  ALKPHOS 74  BILITOT 0.6  PROT 7.2  ALBUMIN 3.6   No results for input(s): LIPASE, AMYLASE in the last 168 hours. No results for input(s): AMMONIA in the last 168 hours. Coagulation Profile: No results for input(s): INR, PROTIME in the last 168 hours. Cardiac Enzymes: No results for input(s): CKTOTAL, CKMB, CKMBINDEX, TROPONINI in the last 168 hours. BNP (last 3 results) No results for input(s): PROBNP in the last 8760 hours. HbA1C: No results for input(s): HGBA1C in the last 72 hours. CBG: Recent Labs  Lab 12/08/19 1232 12/09/19 1200  GLUCAP 106* 161*   Lipid Profile: No results for input(s): CHOL, HDL, LDLCALC, TRIG, CHOLHDL, LDLDIRECT in the last 72 hours. Thyroid Function Tests: No results for input(s): TSH, T4TOTAL, FREET4, T3FREE, THYROIDAB in the last 72 hours. Anemia Panel: No results for input(s): VITAMINB12, FOLATE, FERRITIN, TIBC, IRON, RETICCTPCT in the last 72 hours. Sepsis Labs: No results for input(s): PROCALCITON, LATICACIDVEN in the last 168 hours.  Recent Results (from the past 240 hour(s))  SARS Coronavirus 2 by RT PCR (hospital order, performed in [facility] hospital lab) Nasopharyngeal Nasopharyngeal Swab     Status: None   Collection Time: 12/09/19 12:42 AM   Specimen: Nasopharyngeal Swab  Result Value Ref Range Status   SARS Coronavirus 2 NEGATIVE NEGATIVE Final    Comment: (NOTE) SARS-CoV-2 target nucleic acids are NOT  DETECTED.  The SARS-CoV-2 RNA is generally detectable in upper and lower respiratory specimens during the acute phase of infection. The lowest concentration of SARS-CoV-2 viral copies this assay can detect is 250 copies / mL. A negative result does not preclude SARS-CoV-2 infection and should not be used as the sole basis for treatment or other patient management decisions.  A negative result may occur with improper specimen collection / handling, submission of specimen other than nasopharyngeal swab, presence of viral mutation(s) within the areas targeted by this assay, and inadequate number of viral copies (<250 copies / mL). A negative result  must be combined with clinical observations, patient history, and epidemiological information.  Fact Sheet for Patients:   BoilerBrush.com.cy  Fact Sheet for Healthcare Providers: https://pope.com/  This test is not yet approved or  cleared by the [place] FDA and has been authorized for detection and/or diagnosis of SARS-CoV-2 by FDA under an Emergency Use Authorization (EUA).  This EUA will remain in effect (meaning this test can be used) for the duration of the COVID-19 declaration under Section 564(b)(1) of the Act, 21 U.S.C. section 360bbb-3(b)(1), unless the authorization is terminated or revoked sooner.  Performed at [facility], [address] [place], [place] [address]   MRSA PCR Screening     Status: None   Collection Time: 12/09/19 12:06 PM   Specimen: Nasopharyngeal  Result Value Ref Range Status   MRSA by PCR NEGATIVE NEGATIVE Final    Comment:        The GeneXpert MRSA Assay (FDA approved for NASAL specimens only), is one component of a comprehensive MRSA colonization surveillance program. It is not intended to diagnose MRSA infection nor to guide or monitor treatment for MRSA infections. Performed at [facility], [address], [place], [place]  [address]      RN Pressure Injury Documentation:     Estimated body mass index is 23.6 kg/m as calculated from the following:   Height as of this encounter: 5\' 9"  (1.753 m).   Weight as of this encounter: 72.5 kg.  Malnutrition Type:      Malnutrition Characteristics:      Nutrition Interventions:     Radiology Studies: DG Chest Port 1 View  Result Date: 12/10/2019 CLINICAL DATA:  Respiratory failure. EXAM: PORTABLE CHEST 1 VIEW COMPARISON:  December 08, 2019. FINDINGS: Stable cardiomediastinal silhouette. No pneumothorax or pleural effusion is noted. Both lungs are clear. The visualized skeletal structures are unremarkable. IMPRESSION: No active disease. Electronically Signed   By: December 10, 2019 M.D.   On: 12/10/2019 08:07   Scheduled Meds: . amLODipine  10 mg Oral Daily  . aspirin  81 mg Oral Daily  . atorvastatin  40 mg Oral Daily  . Chlorhexidine Gluconate Cloth  6 each Topical Daily  . folic acid  1 mg Oral Daily  . heparin  5,000 Units Subcutaneous Q8H  . metoprolol succinate  25 mg Oral Daily  . nicotine  7 mg Transdermal Daily  . thiamine  100 mg Oral Daily   Continuous Infusions:   LOS: 2 days   12/12/2019, DO [facility] PAGER is on AMION  If 7PM-7AM, please contact night-coverage [email or web address]

## 2019-12-11 NOTE — TOC Initial Note (Signed)
Transition of Care [facility]) - Initial/Assessment Note    Patient Details  Name: Gerald Morgan MRN: [id number] Date of Birth: 11-May-1960  [facility] CM/SW Contact:    Kermit Balo, RN Phone Number: 12/11/2019, 2:44 PM  Clinical Narrative:                 Pt without insurance and PCP. Pt interested in getting set up with one of the [facility]. CM was able to get an appt and placed on AVS.  Pt will need assist with d/c meds.  Pt denies issues with transportation.  Cm has sent email to financial counseling about seeing pt prior to d/c.  TOC following.  Expected Discharge Plan: Home/Self Care Barriers to Discharge: Inadequate or no insurance, Continued Medical Work up, Barriers Unresolved (comment)   Patient Goals and CMS Choice     Choice offered to / list presented to : Patient  Expected Discharge Plan and Services Expected Discharge Plan: Home/Self Care In-house Referral: Financial Counselor Discharge Planning Services: CM Consult   Living arrangements for the past 2 months: Single Family Home                                      Prior Living Arrangements/Services Living arrangements for the past 2 months: Single Family Home Lives with:: Parents (dad) Patient language and need for interpreter reviewed:: Yes Do you feel safe going back to the place where you live?: Yes        Care giver support system in place?: No (comment)   Criminal Activity/Legal Involvement Pertinent to Current Situation/Hospitalization: No - Comment as needed  Activities of Daily Living Home Assistive Devices/Equipment: None ADL Screening (condition at time of admission) Patient's cognitive ability adequate to safely complete daily activities?: Yes Is the patient deaf or have difficulty hearing?: No Does the patient have difficulty seeing, even when wearing glasses/contacts?: No Does the patient have difficulty concentrating, remembering, or making decisions?:  No Patient able to express need for assistance with ADLs?: Yes Does the patient have difficulty dressing or bathing?: No Independently performs ADLs?: Yes (appropriate for developmental age) Does the patient have difficulty walking or climbing stairs?: No Weakness of Legs: None Weakness of Arms/Hands: None  Permission Sought/Granted                  Emotional Assessment Appearance:: Appears stated age Attitude/Demeanor/Rapport: Engaged Affect (typically observed): Accepting Orientation: : Oriented to Self, Oriented to Place, Oriented to  Time, Oriented to Situation   Psych Involvement: No (comment)  Admission diagnosis:  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 Hypertensive urgency 12/09/2019  . Facial trauma 04/17/2014  . Subdural hematoma caused by concussion (HCC) 04/17/2014   PCP:  Patient, No Pcp Per Pharmacy:   [facility] [address] ([address] [place] - [address] [place] ([place]) [place] [address] Phone: [phone] Fax: [phone]     Social Determinants of Health (SDOH) Interventions    Readmission Risk Interventions No flowsheet data found.

## 2019-12-11 NOTE — Progress Notes (Signed)
RN gave report to RN on 3W. Rn notifed pt about transfer and collected all of pt's belongings. Two cell phones and food from home were sent with pt to 6N. Pt was transferred in a wheelchair. RN waited for 3W RN to enter the room before leaving the patient.

## 2019-12-11 NOTE — Evaluation (Signed)
Clinical/Bedside Swallow Evaluation Patient Details  Name: Gerald Morgan MRN: [id number] Date of Birth: 07/28/1960  Today's Date: 12/11/2019 Time: SLP Start Time (ACUTE ONLY): 1138 SLP Stop Time (ACUTE ONLY): 1148 SLP Time Calculation (min) (ACUTE ONLY): 10 min  Past Medical History:  Past Medical History:  Diagnosis Date   Hypertension    Past Surgical History:  Past Surgical History:  Procedure Laterality Date   BRAIN SURGERY     CRANIOTOMY  09/19/2011   Procedure: CRANIOTOMY HEMATOMA EVACUATION EPIDURAL;  Surgeon: Barnett Abu, MD;  Location: [facility];  Service: Neurosurgery;  Laterality: Left;  Redo craniotomy for evacuation of epidural hematoma   CRANIOTOMY Left 04/17/2014   Procedure: CRANIOTOMY HEMATOMA EVACUATION SUBDURAL;  Surgeon: Tressie Stalker, MD;  Location: [facility];  Service: Neurosurgery;  Laterality: Left;  left   HPI:  Pt is a 59 year old male with HTN who presented with shortness of breath and found with pulmonary edema secondary to hypertensive emergency. He was started on BiPAP on and IV anti-hypertensive agents. CXR 8/25 was negative for acute changes. MRI brain: Few punctate acute left cerebral infarcts in the MCA territory. Advanced chronic microvascular ischemic changes. 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   Assessment / Plan / Recommendation Clinical Impression  Pt was seen for bedside swallow evaluation and he denied a history of dysphagia. Oral mechanism exam was [facility] and he presented with adequate, natural dentition. He tolerated all solids and liquids without signs or symptoms of oropharyngeal dysphagia. A regular texture diet with thin liquids is recommended at this time and further skilled SLP services are not clinically indicated for swallowing. Pt denied any changes in speech, language or cognition; however, pt's sister reported the pt having new word retrieval difficulty and needing additional time for sentence  formulation. A speech/language evaluation may therefore be beneficial.  SLP Visit Diagnosis: Dysphagia, unspecified (R13.10)    Aspiration Risk  No limitations    Diet Recommendation Regular;Thin liquid   Liquid Administration via: Cup;Straw Medication Administration: Whole meds with liquid Postural Changes: Seated upright at 90 degrees    Other  Recommendations Oral Care Recommendations: Oral care BID   Follow up Recommendations None      Frequency and Duration            Prognosis        Swallow Study   General Date of Onset: 12/10/19 HPI: Pt is a 59 year old male with HTN who presented with shortness of breath and found with pulmonary edema secondary to hypertensive emergency. He was started on BiPAP on and IV anti-hypertensive agents. CXR 8/25 was negative for acute changes. MRI brain: Few punctate acute left cerebral infarcts in the MCA territory. Advanced chronic microvascular ischemic changes. 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 Type of Study: Bedside Swallow Evaluation Previous Swallow Assessment: None Diet Prior to this Study: Thin liquids Temperature Spikes Noted: No Respiratory Status: Room air History of Recent Intubation: No Behavior/Cognition: Alert;Cooperative;Pleasant mood Oral Cavity Assessment: Within Functional Limits Oral Care Completed by SLP: No Oral Cavity - Dentition: Adequate natural dentition Vision: Functional for self-feeding Self-Feeding Abilities: Able to feed self Patient Positioning: Upright in bed;Postural control adequate for testing Baseline Vocal Quality: Normal Volitional Cough: Strong Volitional Swallow: Able to elicit    Oral/Motor/Sensory Function Overall Oral Motor/Sensory Function: Within functional limits   Ice Chips Ice chips: Within functional limits Presentation: Spoon   Thin Liquid Thin Liquid: Within functional limits Presentation: Straw  Nectar Thick Nectar Thick Liquid: Not  tested   Honey Thick Honey Thick Liquid: Not tested   Puree Puree: Within functional limits Presentation: Spoon   Solid     Solid: Within functional limits Presentation: Self Fed     Desaray Marschner I. Vear Clock, MS, CCC-SLP Acute Rehabilitation Services Office number [phone] Pager [phone]  Scheryl Marten 12/11/2019,12:44 PM

## 2019-12-12 ENCOUNTER — Inpatient Hospital Stay (HOSPITAL_COMMUNITY): Payer: Self-pay

## 2019-12-12 DIAGNOSIS — I639 Cerebral infarction, unspecified: Secondary | ICD-10-CM

## 2019-12-12 DIAGNOSIS — I6330 Cerebral infarction due to thrombosis of unspecified cerebral artery: Secondary | ICD-10-CM

## 2019-12-12 DIAGNOSIS — I6389 Other cerebral infarction: Secondary | ICD-10-CM

## 2019-12-12 LAB — CBC WITH DIFFERENTIAL/PLATELET
Abs Immature Granulocytes: 0.02 10*3/uL (ref 0.00–0.07)
Basophils Absolute: 0 10*3/uL (ref 0.0–0.1)
Basophils Relative: 1 %
Eosinophils Absolute: 0.1 10*3/uL (ref 0.0–0.5)
Eosinophils Relative: 2 %
HCT: 43.9 % (ref 39.0–52.0)
Hemoglobin: 14.3 g/dL (ref 13.0–17.0)
Immature Granulocytes: 0 %
Lymphocytes Relative: 20 %
Lymphs Abs: 1.4 10*3/uL (ref 0.7–4.0)
MCH: 30.4 pg (ref 26.0–34.0)
MCHC: 32.6 g/dL (ref 30.0–36.0)
MCV: 93.2 fL (ref 80.0–100.0)
Monocytes Absolute: 0.8 10*3/uL (ref 0.1–1.0)
Monocytes Relative: 12 %
Neutro Abs: 4.3 10*3/uL (ref 1.7–7.7)
Neutrophils Relative %: 65 %
Platelets: 300 10*3/uL (ref 150–400)
RBC: 4.71 MIL/uL (ref 4.22–5.81)
RDW: 13.7 % (ref 11.5–15.5)
WBC: 6.6 10*3/uL (ref 4.0–10.5)
nRBC: 0 % (ref 0.0–0.2)

## 2019-12-12 LAB — ECHOCARDIOGRAM COMPLETE
Area-P 1/2: 5.97 cm2
Calc EF: 48 %
Height: 69 in
S' Lateral: 3.5 cm
Single Plane A2C EF: 46.3 %
Single Plane A4C EF: 51.7 %
Weight: 2557.34 oz

## 2019-12-12 LAB — HEMOGLOBIN A1C
Hgb A1c MFr Bld: 5.5 % (ref 4.8–5.6)
Mean Plasma Glucose: 111.15 mg/dL

## 2019-12-12 LAB — LIPID PANEL
Cholesterol: 227 mg/dL — ABNORMAL HIGH (ref 0–200)
HDL: 46 mg/dL (ref >40)
LDL Cholesterol: 159 mg/dL — ABNORMAL HIGH (ref 0–99)
Total CHOL/HDL Ratio: 4.9 RATIO
Triglycerides: 108 mg/dL (ref <150)
VLDL: 22 mg/dL (ref 0–40)

## 2019-12-12 LAB — COMPREHENSIVE METABOLIC PANEL
ALT: 11 U/L (ref 0–44)
AST: 15 U/L (ref 15–41)
Albumin: 3.4 g/dL — ABNORMAL LOW (ref 3.5–5.0)
Alkaline Phosphatase: 68 U/L (ref 38–126)
Anion gap: 12 (ref 5–15)
BUN: 14 mg/dL (ref 6–20)
CO2: 22 mmol/L (ref 22–32)
Calcium: 9.2 mg/dL (ref 8.9–10.3)
Chloride: 106 mmol/L (ref 98–111)
Creatinine, Ser: 1.94 mg/dL — ABNORMAL HIGH (ref 0.61–1.24)
GFR calc Af Amer: 44 mL/min — ABNORMAL LOW (ref >60)
GFR calc non Af Amer: 38 mL/min — ABNORMAL LOW (ref >60)
Glucose, Bld: 105 mg/dL — ABNORMAL HIGH (ref 70–99)
Potassium: 3.5 mmol/L (ref 3.5–5.1)
Sodium: 140 mmol/L (ref 135–145)
Total Bilirubin: 0.8 mg/dL (ref 0.3–1.2)
Total Protein: 6.9 g/dL (ref 6.5–8.1)

## 2019-12-12 LAB — PHOSPHORUS: Phosphorus: 3.7 mg/dL (ref 2.5–4.6)

## 2019-12-12 LAB — MAGNESIUM: Magnesium: 1.7 mg/dL (ref 1.7–2.4)

## 2019-12-12 MED ORDER — ATORVASTATIN CALCIUM 40 MG PO TABS
40.0000 mg | ORAL_TABLET | Freq: Every day | ORAL | Status: DC
  Administered 2019-12-13: 40 mg via ORAL
  Filled 2019-12-12: qty 1

## 2019-12-12 MED ORDER — METOPROLOL SUCCINATE ER 100 MG PO TB24
100.0000 mg | ORAL_TABLET | Freq: Every day | ORAL | Status: DC
  Administered 2019-12-13: 100 mg via ORAL
  Filled 2019-12-12: qty 1

## 2019-12-12 MED ORDER — LABETALOL HCL 5 MG/ML IV SOLN
10.0000 mg | Freq: Once | INTRAVENOUS | Status: AC
  Administered 2019-12-12: 10 mg via INTRAVENOUS
  Filled 2019-12-12: qty 4

## 2019-12-12 MED ORDER — HYDRALAZINE HCL 25 MG PO TABS
25.0000 mg | ORAL_TABLET | Freq: Three times a day (TID) | ORAL | Status: DC
  Administered 2019-12-12 – 2019-12-13 (×3): 25 mg via ORAL
  Filled 2019-12-12 (×4): qty 1

## 2019-12-12 MED ORDER — MAGNESIUM SULFATE IN D5W 1-5 GM/100ML-% IV SOLN
1.0000 g | Freq: Once | INTRAVENOUS | Status: AC
  Administered 2019-12-12: 1 g via INTRAVENOUS
  Filled 2019-12-12: qty 100

## 2019-12-12 MED ORDER — ATORVASTATIN CALCIUM 80 MG PO TABS: 80.0000 mg | ORAL_TABLET | Freq: Every day | ORAL | Status: DC

## 2019-12-12 MED ORDER — POTASSIUM CHLORIDE CRYS ER 20 MEQ PO TBCR
40.0000 meq | EXTENDED_RELEASE_TABLET | Freq: Once | ORAL | Status: AC
  Administered 2019-12-12: 40 meq via ORAL
  Filled 2019-12-12: qty 2

## 2019-12-12 MED ORDER — CLOPIDOGREL BISULFATE 75 MG PO TABS
75.0000 mg | ORAL_TABLET | Freq: Every day | ORAL | Status: DC
  Administered 2019-12-12 – 2019-12-13 (×2): 75 mg via ORAL
  Filled 2019-12-12 (×3): qty 1

## 2019-12-12 NOTE — Evaluation (Signed)
Physical Therapy Evaluation Patient Details Name: Gerald Morgan MRN: [id number] DOB: 02-05-1961 Today's Date: 12/12/2019   History of Present Illness  Patient is a 59 year old male brought to the emergency room with complaints of dizziness and difficult coordination.  He is a regular drinker.  He received a liter of IV fluid the emergency room and developed worsening dyspnea with decrease in oxygen saturation requiring BiPAP.  Clinical Impression  Patient received in bed, reports he is doing well. He is agreeable to PT assessment. Strength/ sensation normal on Left and right. No reports of dizziness at this time. He performed bed mobility independently. Transfers with min guard. Ambulated 200 feet without ad, min guard to min assist. Mildly unsteady at times with anterior leaning with ambulation. No overt LOB. He will continue to benefit from skilled PT while here to improve functional mobility and safety for return home.      Follow Up Recommendations No PT follow up    Equipment Recommendations  None recommended by PT    Recommendations for Other Services       Precautions / Restrictions Precautions Precautions: Fall Precaution Comments: mod fall Restrictions Weight Bearing Restrictions: No      Mobility  Bed Mobility Overal bed mobility: Independent                Transfers Overall transfer level: Needs assistance Equipment used: None Transfers: Sit to/from Stand Sit to Stand: Supervision            Ambulation/Gait Ambulation/Gait assistance: Min guard Gait Distance (Feet): 200 Feet Assistive device: None Gait Pattern/deviations: Step-through pattern;Drifts right/left Gait velocity: mild decr   General Gait Details: patient is mildly unsteady with ambulation, initially walking with slides on that were too big. Suggested he take them off for safety. Patient almost has  a forward lean with ambulation requiring min guard assist.  No overt lob.  Stairs             Wheelchair Mobility    Modified Rankin (Stroke Patients Only)       Balance Overall balance assessment: Needs assistance;Mild deficits observed, not formally tested Sitting-balance support: Feet supported Sitting balance-Leahy Scale: Good     Standing balance support: No upper extremity supported;During functional activity Standing balance-Leahy Scale: Fair Standing balance comment: mildly unsteady, no lob.                             Pertinent Vitals/Pain Pain Assessment: No/denies pain    Home Living Family/patient expects to be discharged to:: Private residence Living Arrangements: Parent Available Help at Discharge: Family;Available 24 hours/day Type of Home: House Home Access: Stairs to enter Entrance Stairs-Rails: None Entrance Stairs-Number of Steps: 2 Home Layout: One level Home Equipment: None      Prior Function Level of Independence: Independent               Hand Dominance        Extremity/Trunk Assessment   Upper Extremity Assessment Upper Extremity Assessment: Overall WFL for tasks assessed    Lower Extremity Assessment Lower Extremity Assessment: Overall WFL for tasks assessed    Cervical / Trunk Assessment Cervical / Trunk Assessment: Normal  Communication   Communication: No difficulties  Cognition Arousal/Alertness: Awake/alert Behavior During Therapy: WFL for tasks assessed/performed Overall Cognitive Status: Within Functional Limits for tasks assessed  General Comments      Exercises     Assessment/Plan    PT Assessment Patient needs continued PT services  PT Problem List Decreased strength;Decreased mobility;Decreased balance       PT Treatment Interventions Therapeutic activities;Gait training;Therapeutic exercise;Patient/family education;Stair training;Functional mobility training;Balance training    PT Goals (Current goals can be found  in the Care Plan section)  Acute Rehab PT Goals Patient Stated Goal: to return home PT Goal Formulation: With patient/family Time For Goal Achievement: 12/19/19 Potential to Achieve Goals: Good    Frequency Min 3X/week   Barriers to discharge        Co-evaluation               AM-PAC PT "6 Clicks" Mobility  Outcome Measure Help needed turning from your back to your side while in a flat bed without using bedrails?: None Help needed moving from lying on your back to sitting on the side of a flat bed without using bedrails?: None Help needed moving to and from a bed to a chair (including a wheelchair)?: A Little Help needed standing up from a chair using your arms (e.g., wheelchair or bedside chair)?: A Little Help needed to walk in hospital room?: A Little Help needed climbing 3-5 steps with a railing? : A Little 6 Click Score: 20    End of Session Equipment Utilized During Treatment: Gait belt Activity Tolerance: Patient tolerated treatment well Patient left: in bed;with bed alarm set;with call bell/phone within reach;with family/visitor present Nurse Communication: Mobility status PT Visit Diagnosis: Unsteadiness on feet (R26.81)    Time: 1100-1115 PT Time Calculation (min) (ACUTE ONLY): 15 min   Charges:   PT Evaluation $PT Eval Moderate Complexity: 1 Mod          Terryn Rosenkranz, PT, GCS 12/12/19,12:21 PM

## 2019-12-12 NOTE — Progress Notes (Signed)
PROGRESS NOTE    Gerald Morgan  WGN:[id number] DOB: 1960/12/08 DOA: 12/08/2019 PCP: Patient, No Pcp Per  Brief Narrative:  The patient is a 59 year old African-American male who is a regular drinker with a past medical history of hypertension who does not take any blood pressure medications, as well as a history of 2 craniotomies for intracranial hemorrhage in the past, who presented with acute onset of dizziness and difficult coordination.  He received a liter of fluid in the ED and began to develop worsening dyspnea with decreased oxygen saturation requiring BiPAP.  His subsequently placed on nitroglycerin drip and chest x-ray at that time showed vascular congestion and increased interstitial edema.  He then subsequently was given a dose of IV Lasix.  He subsequently was in flash pulmonary edema after diuresis improved.  Blood pressure was significantly still elevated so his nitroglycerin drip was stopped and he was to changed to a Cardene drip.  He subsequently started on p.o. medications and his blood pressure improved and he was transitioned out of the ICU after he is weaned off the Cardene drip.  His head CT scan was done initially and showed no evidence of any acute intracranial abnormalities but did show encephalomalacia in the left greater than the right frontal lobe as well as moderate bilateral white white matter hypodensity which was further evaluated with an MRI.  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  He was noted to have anterior inferior frontal encephalomalacia sequela of extra-axial hemorrhage as well.  Neurology was was consulted and will see the patient in the a.m.  His blood pressure still remains a little high and PT OT still pending to see the patient.  **Interim History Neurology was consulted and they did a further stroke work-up  given his punctate infarcts.  The neurologist felt that there were likely due to synchronized small vessel disease given multiple uncontrolled risk factors and it was less likely cardioembolic overt secondary to vasculitis.  Assessment & Plan:   Active Problems:   Hypertensive urgency   Cerebral thrombosis with cerebral infarction  Hypertensive emergency complicated with pulmonary edema -Subsequently went into flash pulmonary edema had to be placed on BiPAP -He was on a Cardene drip yesterday and pressures have been been improving however still remains significant elevated; blood pressure this afternoon was 194/118 -Continue amlodipine 10 mg p.o. daily; metoprolol switched to succinate and was at 50 mg p.o. daily and will go to 100 mg  -We will add p.o. Hydralazine 25 mg po TID as well -He has hydralazine 10 mg every 4 hours as needed for systolic blood pressure being 784 or diastolic blood pressure greater than 100 -Continue to monitor blood pressures per protocol and goal blood pressure is to maintain a systolic less than 160 if he can readily achieves he can likely be discharged  Acute respiratory failure with hypoxia in the setting of flash pulmonary edema from hypertensive emergency  -Placed on BiPAP initially and had been weaned off after diuresis -Patient feels much better and he was given IV Lasix 80 mg total while he was in the intensive care unit with increase of his serum creatinine from 1.58 -> 2.01; now his creatinine is improving and today is 1.94 -Further diuretics have been held  Renal Insufficiency but likely has some component of chronic kidney disease given his history of uncontrolled hypertension; Suspect CKD Stage 3a -Suspecting  that his kidney disease is stage IIIa -Currently his BUN/creatinine had worsened after he was given diuretics and went from 13/1.58 -> 14/2.10 -> 14/1.99 -> 14/1.94 -Avoid further nephrotoxic medications, contrast dyes, hypotension and Renally  adjust medications -Repeat CMP in the AM   Hypophosphatemia -Patient's phosphorus was 2.2 and is now improved 3.7 -Replete with p.o. K-Phos Neutral 500 mg twice daily x2 doses yesterday -continue to monitor and treat as necessary -Repeat Phos Level in the AM  Acute punctate cerebral infarct in the MCA territory -In the setting of his hypertensive emergency -Head CT Showed "No CT evidence for acute intracranial abnormality. Encephalomalacia in the left greater than right frontal lobe. Moderate bilateral white matter hypodensity, possible age advanced chronic small vessel ischemic change though other white matter disease is not excluded. Further evaluation with MRI could be considered, which could be performed non emergently unless clinical situation dictates otherwise." -MRI Brain showed "Few punctate acute left cerebral infarcts in the MCA territory. Advanced chronic microvascular ischemic changes. 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 Anterior inferior left frontal encephalomalacia. Sequelae of left extra-axial hemorrhage -MRA of the Brain done and showed "No intracranial large or medium vessel occlusion. No anterior circulation stenosis. 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 Vertebrals: Bilateral vertebral arteries demonstrate antegrade flow.  Subclavians: Normal flow hemodynamics were seen in bilateral subclavian arteries." -ECHOCardiogram showed "Left Ventricle: Left ventricular ejection fraction, by estimation, is 45  to 50%. The left ventricle has mildly decreased function. The left ventricle has no regional wall motion abnormalities. The left ventricular  internal cavity size was normal in size. There is mild left ventricular hypertrophy. Left ventricular diastolic parameters are consistent with Grade II diastolic  dysfunction  (pseudonormalization)." There was no source of Embolus as well  -Neurology ordered LE Venous Duplex and showed no evidence of DVT -They also ordered a Hypercoaguable workup which is still pending  -PT/OT to evaluate and Treat and recommend no follow up -Neurology recommending aspirin 81 mg and Plavix 75 mg dual antiplatelet therapy for 3 weeks and then just aspirin alone -SLP cleared the patient for a Regular Diet  -Patient was placed on aspirin 81 mg and atorvastatin and the ICU team stated they consulted neurology but neurology did not formally get this consult so we have reconsulted and Dr. Roda Shutters will see the patient in the morning -Continue to Monitor on Telemetry -Neurology recommends discharging once blood pressure is better controlled and he will follow up with the stroke nurse practitioner at [facility] in 4 weeks  HLD -Patient's Lipid Panel showed total cholesterol/HDL ratio of 4.9, cholesterol level of 227, HDL of 46, LDL 159, triglycerides of 188, VLDL 22 -Started on atorvastatin 40 mg p.o. daily and will continue at discharge  Hypokalemia -The patient's potassium is improved and is improved and went from 3.0 -> 4.0 -> 3.5  -Continue to monitor output as necessary  -Repeat CMP in a.m.  History of significant alcohol use  -Continue to monitor for signs and symptoms of withdrawal; currently no withdrawal symptoms noted -Continue with folic acid 1 mg p.o. daily, and thiamine -We will add multivitamin with minerals -Social work administered the CAGE questionnaire  Tobacco Abuse -Continues to smoke -Smoking cessation counseling given -We will give a nicotine patch   DVT prophylaxis: Heparin 5,000 units sq q8h Code Status: FULL CODE  Family  Communication: Discussed with family at bedside  Disposition Plan: Pending further evaluation by PT/OT and Final Recc's from Neurology; neurology signed off the case but his blood pressure still remains elevated  so we will need to bring this down before discharging  Status is: Inpatient  Remains inpatient appropriate because:Unsafe d/c plan, IV treatments appropriate due to intensity of illness or inability to take PO and Inpatient level of care appropriate due to severity of illness   Dispo: The patient is from: Home              Anticipated d/c is to: TBD              Anticipated d/c date is: 1 day              Patient currently is not medically stable to d/c.  Consultants:   PCCM Transfer  Neurology    Procedures: ECHOCARDIOGRAM IMPRESSIONS    1. Left ventricular ejection fraction, by estimation, is 45 to 50%. The  left ventricle has mildly decreased function. The left ventricle has no  regional wall motion abnormalities. There is mild left ventricular  hypertrophy. Left ventricular diastolic  parameters are consistent with Grade II diastolic dysfunction  (pseudonormalization).  2. Right ventricular systolic function is normal. The right ventricular  size is normal. There is normal pulmonary artery systolic pressure. The  estimated right ventricular systolic pressure is 27.6 mmHg.  3. The mitral valve is normal in structure. Trivial mitral valve  regurgitation. No evidence of mitral stenosis.  4. The aortic valve is normal in structure. Aortic valve regurgitation is  trivial. No aortic stenosis is present.  5. The inferior vena cava is normal in size with greater than 50%  respiratory variability, suggesting right atrial pressure of 3 mmHg.   Conclusion(s)/Recommendation(s): No intracardiac source of embolism  detected on this transthoracic study. A transesophageal echocardiogram is  recommended to exclude cardiac source of embolism if clinically indicated.   FINDINGS  Left Ventricle: Left ventricular ejection fraction, by estimation, is 45  to 50%. The left ventricle has mildly decreased function. The left  ventricle has no regional wall motion abnormalities. The left  ventricular  internal cavity size was normal in  size. There is mild left ventricular hypertrophy. Left ventricular  diastolic parameters are consistent with Grade II diastolic dysfunction  (pseudonormalization).     LV Wall Scoring:  The anterior septum, basal inferior segment, and basal inferoseptal  segment  are akinetic. The mid inferior segment is hypokinetic.   Right Ventricle: The right ventricular size is normal. No increase in  right ventricular wall thickness. Right ventricular systolic function is  normal. There is normal pulmonary artery systolic pressure. The tricuspid  regurgitant velocity is 2.48 m/s, and  with an assumed right atrial pressure of 3 mmHg, the estimated right  ventricular systolic pressure is 27.6 mmHg.   Left Atrium: Left atrial size was normal in size.   Right Atrium: Right atrial size was normal in size.   Pericardium: There is no evidence of pericardial effusion.   Mitral Valve: The mitral valve is normal in structure. Normal mobility of  the mitral valve leaflets. Trivial mitral valve regurgitation. No evidence  of mitral valve stenosis.   Tricuspid Valve: The tricuspid valve is normal in structure. Tricuspid  valve regurgitation is trivial. No evidence of tricuspid stenosis.   Aortic Valve: The aortic valve is normal in structure. Aortic valve  regurgitation is trivial. No aortic stenosis is present.   Pulmonic Valve:  The pulmonic valve was normal in structure. Pulmonic valve  regurgitation is not visualized. No evidence of pulmonic stenosis.   Aorta: The aortic root is normal in size and structure.   Venous: The inferior vena cava is normal in size with greater than 50%  respiratory variability, suggesting right atrial pressure of 3 mmHg.   IAS/Shunts: No atrial level shunt detected by color flow Doppler.     LEFT VENTRICLE  PLAX 2D  LVIDd:     4.00 cm   Diastology  LVIDs:     3.50 cm   LV e' lateral:  6.49 cm/s    LV PW:     1.20 cm   LV E/e' lateral: 13.3  LV IVS:    1.20 cm   LV e' medial:  4.87 cm/s  LVOT diam:   2.00 cm   LV E/e' medial: 17.7  LV SV:     53  LV SV Index:  28  LVOT Area:   3.14 cm    LV Volumes (MOD)  LV vol d, MOD A2C: 125.0 ml  LV vol d, MOD A4C: 133.0 ml  LV vol s, MOD A2C: 67.1 ml  LV vol s, MOD A4C: 64.3 ml  LV SV MOD A2C:   57.9 ml  LV SV MOD A4C:   133.0 ml  LV SV MOD BP:   61.7 ml   RIGHT VENTRICLE  RV S prime:   9.49 cm/s  TAPSE (M-mode): 1.8 cm   LEFT ATRIUM       Index    RIGHT ATRIUM      Index  LA diam:    3.90 cm 2.08 cm/m RA Area:   14.70 cm  LA Vol (A2C):  49.6 ml 26.41 ml/m RA Volume:  36.80 ml 19.59 ml/m  LA Vol (A4C):  46.5 ml 24.76 ml/m  LA Biplane Vol: 48.7 ml 25.93 ml/m  AORTIC VALVE  LVOT Vmax:  106.00 cm/s  LVOT Vmean: 67.600 cm/s  LVOT VTI:  0.170 m    AORTA  Ao Root diam: 3.00 cm   MITRAL VALVE        TRICUSPID VALVE  MV Area (PHT): 5.97 cm  TR Peak grad:  24.6 mmHg  MV Decel Time: 127 msec  TR Vmax:    248.00 cm/s  MV E velocity: 86.40 cm/s  MV A velocity: 35.60 cm/s SHUNTS  MV E/A ratio: 2.43    Systemic VTI: 0.17 m               Systemic Diam: 2.00 cm   Antimicrobials:  Anti-infectives (From admission, onward)   None     Subjective: Seen and examined at bedside and states that he is doing well.  No nausea or vomiting.  Denies any chest pain, lightheadedness or dizziness.  No other concerns or complaints at this time.  Objective: Vitals:   12/12/19 0834 12/12/19 1144 12/12/19 1429 12/12/19 1524  BP: (!) 186/111 (!) 194/118 (!) 168/108 (!) 165/107  Pulse: 78 78 75 81  Resp: 17 17  19   Temp: 98.2 F (36.8 C) 97.8 F (36.6 C)  97.6 F (36.4 C)  TempSrc: Oral Oral  Oral  SpO2: 100% 100%  99%  Weight:      Height:        Intake/Output Summary (Last 24 hours) at 12/12/2019 1644 Last data filed at 12/12/2019  1115 Gross per 24 hour  Intake 240 ml  Output --  Net 240 ml   [employer]iled Weights  12/08/19 1228 12/10/19 0500  Weight: 72.6 kg 72.5 kg   Examination: Physical Exam:  Constitutional: WN/WD African-American male who is currently in no acute distress appears calm and comfortable Eyes: Lids and conjunctivae normal, sclerae anicteric  ENMT: External Ears, Nose appear normal. Grossly normal hearing. Neck: Appears normal, supple, no cervical masses, normal ROM, no appreciable thyromegaly; no JVD Respiratory: Diminished to auscultation bilaterally, no wheezing, rales, rhonchi or crackles. Normal respiratory effort and patient is not tachypenic. No accessory muscle use.  Unlabored breathing Cardiovascular: RRR, no murmurs / rubs / gallops. S1 and S2 auscultated. No extremity edema.  Abdomen: Soft, non-tender, non-distended. Bowel sounds positive.  GU: Deferred. Musculoskeletal: No clubbing / cyanosis of digits/nails. No joint deformity upper and lower extremities.  Skin: No rashes, lesions, ulcers on limited skin evaluation. No induration; Warm and dry.  Neurologic: CN 2-12 grossly intact with no focal deficits. Romberg sign and cerebellar reflexes not assessed.  Psychiatric: Normal judgment and insight. Alert and oriented x 3. Normal mood and appropriate affect.   Data Reviewed: I have personally reviewed following labs and imaging studies  CBC: Recent Labs  Lab 12/08/19 1229 12/09/19 0052 12/10/19 0032 12/11/19 0918 12/12/19 0413  WBC 6.5 9.3 9.1 6.3 6.6  NEUTROABS  --   --  5.3 3.7 4.3  HGB 14.1 14.7 13.9 14.9 14.3  HCT 43.2 46.4 41.7 45.1 43.9  MCV 93.1 95.3 92.9 93.4 93.2  PLT 244 240 254 307 300   Basic Metabolic Panel: Recent Labs  Lab 12/08/19 1229 12/08/19 2135 12/09/19 0052 12/10/19 0032 12/11/19 0918 12/12/19 0413  NA 138  --  138 139 139 140  K 3.2*  --  3.1* 3.0* 4.0 3.5  CL 101  --  105 103 106 106  CO2 27  --  GLUCOSE 109*  --  162* 114* 102*  105*  BUN 11  --  CREATININE 1.53*  --  1.58* 2.10* 1.99* 1.94*  CALCIUM 8.8*  --  8.3* 8.5* 9.1 9.2  MG  --  2.0 1.8 1.8 1.9 1.7  PHOS  --   --  4.1 2.5 2.2* 3.7   GFR: Estimated Creatinine Clearance: 42.5 mL/min (A) (by C-G formula based on SCr of 1.94 mg/dL (H)). Liver Function Tests: Recent Labs  Lab 12/11/19 0918 12/12/19 0413  AST 18 15  ALT 10 11  ALKPHOS 74 68  BILITOT 0.6 0.8  PROT 7.2 6.9  ALBUMIN 3.6 3.4*   No results for input(s): LIPASE, AMYLASE in the last 168 hours. No results for input(s): AMMONIA in the last 168 hours. Coagulation Profile: No results for input(s): INR, PROTIME in the last 168 hours. Cardiac Enzymes: No results for input(s): CKTOTAL, CKMB, CKMBINDEX, TROPONINI in the last 168 hours. BNP (last 3 results) No results for input(s): PROBNP in the last 8760 hours. HbA1C: Recent Labs    12/12/19 0413  HGBA1C 5.5   CBG: Recent Labs  Lab 12/08/19 1232 12/09/19 1200  GLUCAP 106* 161*   Lipid Profile: Recent Labs    12/12/19 0413  CHOL 227*  HDL 46  LDLCALC 159*  TRIG 108  CHOLHDL 4.9   Thyroid Function Tests: No results for input(s): TSH, T4TOTAL, FREET4, T3FREE, THYROIDAB in the last 72 hours. Anemia Panel: No results for input(s): VITAMINB12, FOLATE, FERRITIN, TIBC, IRON, RETICCTPCT in the last 72 hours. Sepsis Labs: No results for input(s): PROCALCITON, LATICACIDVEN in the last 168 hours.  Recent Results (from the  past 240 hour(s))  SARS Coronavirus 2 by RT PCR (hospital order, performed in [facility] hospital lab) Nasopharyngeal Nasopharyngeal Swab     Status: None   Collection Time: 12/09/19 12:42 AM   Specimen: Nasopharyngeal Swab  Result Value Ref Range Status   SARS Coronavirus 2 NEGATIVE NEGATIVE Final    Comment: (NOTE) SARS-CoV-2 target nucleic acids are NOT DETECTED.  The SARS-CoV-2 RNA is generally detectable in upper and lower respiratory specimens during the acute phase of infection. The  lowest concentration of SARS-CoV-2 viral copies this assay can detect is 250 copies / mL. A negative result does not preclude SARS-CoV-2 infection and should not be used as the sole basis for treatment or other patient management decisions.  A negative result may occur with improper specimen collection / handling, submission of specimen other than nasopharyngeal swab, presence of viral mutation(s) within the areas targeted by this assay, and inadequate number of viral copies (<250 copies / mL). A negative result must be combined with clinical observations, patient history, and epidemiological information.  Fact Sheet for Patients:   BoilerBrush.com.cy  Fact Sheet for Healthcare Providers: https://pope.com/  This test is not yet approved or  cleared by the [place] FDA and has been authorized for detection and/or diagnosis of SARS-CoV-2 by FDA under an Emergency Use Authorization (EUA).  This EUA will remain in effect (meaning this test can be used) for the duration of the COVID-19 declaration under Section 564(b)(1) of the Act, 21 U.S.C. section 360bbb-3(b)(1), unless the authorization is terminated or revoked sooner.  Performed at [facility], [address] [place], [place] [address]   MRSA PCR Screening     Status: None   Collection Time: 12/09/19 12:06 PM   Specimen: Nasopharyngeal  Result Value Ref Range Status   MRSA by PCR NEGATIVE NEGATIVE Final    Comment:        The GeneXpert MRSA Assay (FDA approved for NASAL specimens only), is one component of a comprehensive MRSA colonization surveillance program. It is not intended to diagnose MRSA infection nor to guide or monitor treatment for MRSA infections. Performed at [facility], [address] [place], [place] [address]      RN Pressure Injury Documentation:     Estimated body mass index is 23.6 kg/m as calculated from the following:    Height as of this encounter: 5\' 9"  (1.753 m).   Weight as of this encounter: 72.5 kg.  Malnutrition Type:      Malnutrition Characteristics:      Nutrition Interventions:     Radiology Studies: MR ANGIO HEAD WO CONTRAST  Result Date: 12/12/2019 CLINICAL DATA:  Dizziness. Alcohol abuse. Small acute infarctions in the left MCA territory shown by MRI 3 days ago. EXAM: MRA HEAD WITHOUT CONTRAST TECHNIQUE: Angiographic images of the Circle of Willis were obtained using MRA technique without intravenous contrast. COMPARISON:  MRI 12/09/2019. FINDINGS: Both internal carotid arteries are widely patent through the skull base and siphon regions. The anterior and middle cerebral vessels are patent without proximal stenosis, aneurysm or vascular malformation. Both vertebral arteries are patent to the basilar. No basilar stenosis. 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 IMPRESSION: 1. No intracranial large or medium vessel occlusion. No anterior circulation stenosis. 2. 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 Electronically Signed   By: 12/11/2019 M.D.   On: 12/12/2019 08:23   ECHOCARDIOGRAM COMPLETE  Result  Date: 12/12/2019    ECHOCARDIOGRAM REPORT   Patient Name:   Gerald Morgan Date of Exam: 12/12/2019 Medical Rec #:  [id number]       Height:       69.0 in Accession #:    [id number]      Weight:       159.8 lb Date of Birth:  Oct 29, 1960       BSA:          1.878 m Patient Age:    58 years        BP:           163/107 mmHg Patient Gender: M               HR:           76 bpm. Exam Location:  Inpatient Procedure: 2D Echo, Cardiac Doppler and Color Doppler Indications:    Stroke 434.91/ I163.9  History:        Patient has no prior history of Echocardiogram examinations.                 Risk Factors:Hypertension and Current Smoker.  Sonographer:    Renella Cunas RDCS Referring Phys: [id number] JINDONG XU IMPRESSIONS  1. Left ventricular  ejection fraction, by estimation, is 45 to 50%. The left ventricle has mildly decreased function. The left ventricle has no regional wall motion abnormalities. There is mild left ventricular hypertrophy. Left ventricular diastolic parameters are consistent with Grade II diastolic dysfunction (pseudonormalization).  2. Right ventricular systolic function is normal. The right ventricular size is normal. There is normal pulmonary artery systolic pressure. The estimated right ventricular systolic pressure is 27.6 mmHg.  3. The mitral valve is normal in structure. Trivial mitral valve regurgitation. No evidence of mitral stenosis.  4. The aortic valve is normal in structure. Aortic valve regurgitation is trivial. No aortic stenosis is present.  5. The inferior vena cava is normal in size with greater than 50% respiratory variability, suggesting right atrial pressure of 3 mmHg. Conclusion(s)/Recommendation(s): No intracardiac source of embolism detected on this transthoracic study. A transesophageal echocardiogram is recommended to exclude cardiac source of embolism if clinically indicated. FINDINGS  Left Ventricle: Left ventricular ejection fraction, by estimation, is 45 to 50%. The left ventricle has mildly decreased function. The left ventricle has no regional wall motion abnormalities. The left ventricular internal cavity size was normal in size. There is mild left ventricular hypertrophy. Left ventricular diastolic parameters are consistent with Grade II diastolic dysfunction (pseudonormalization).  LV Wall Scoring: The anterior septum, basal inferior segment, and basal inferoseptal segment are akinetic. The mid inferior segment is hypokinetic. Right Ventricle: The right ventricular size is normal. No increase in right ventricular wall thickness. Right ventricular systolic function is normal. There is normal pulmonary artery systolic pressure. The tricuspid regurgitant velocity is 2.48 m/s, and  with an assumed right  atrial pressure of 3 mmHg, the estimated right ventricular systolic pressure is 27.6 mmHg. Left Atrium: Left atrial size was normal in size. Right Atrium: Right atrial size was normal in size. Pericardium: There is no evidence of pericardial effusion. Mitral Valve: The mitral valve is normal in structure. Normal mobility of the mitral valve leaflets. Trivial mitral valve regurgitation. No evidence of mitral valve stenosis. Tricuspid Valve: The tricuspid valve is normal in structure. Tricuspid valve regurgitation is trivial. No evidence of tricuspid stenosis. Aortic Valve: The aortic valve is normal in structure. Aortic valve regurgitation is trivial. No aortic stenosis is present. Pulmonic  Valve: The pulmonic valve was normal in structure. Pulmonic valve regurgitation is not visualized. No evidence of pulmonic stenosis. Aorta: The aortic root is normal in size and structure. Venous: The inferior vena cava is normal in size with greater than 50% respiratory variability, suggesting right atrial pressure of 3 mmHg. IAS/Shunts: No atrial level shunt detected by color flow Doppler.  LEFT VENTRICLE PLAX 2D LVIDd:         4.00 cm      Diastology LVIDs:         3.50 cm      LV e' lateral:   6.49 cm/s LV PW:         1.20 cm      LV E/e' lateral: 13.3 LV IVS:        1.20 cm      LV e' medial:    4.87 cm/s LVOT diam:     2.00 cm      LV E/e' medial:  17.7 LV SV:         53 LV SV Index:   28 LVOT Area:     3.14 cm  LV Volumes (MOD) LV vol d, MOD A2C: 125.0 ml LV vol d, MOD A4C: 133.0 ml LV vol s, MOD A2C: 67.1 ml LV vol s, MOD A4C: 64.3 ml LV SV MOD A2C:     57.9 ml LV SV MOD A4C:     133.0 ml LV SV MOD BP:      61.7 ml RIGHT VENTRICLE RV S prime:     9.49 cm/s TAPSE (M-mode): 1.8 cm LEFT ATRIUM             Index       RIGHT ATRIUM           Index LA diam:        3.90 cm 2.08 cm/m  RA Area:     14.70 cm LA Vol (A2C):   49.6 ml 26.41 ml/m RA Volume:   36.80 ml  19.59 ml/m LA Vol (A4C):   46.5 ml 24.76 ml/m LA Biplane Vol:  48.7 ml 25.93 ml/m  AORTIC VALVE LVOT Vmax:   106.00 cm/s LVOT Vmean:  67.600 cm/s LVOT VTI:    0.170 m  AORTA Ao Root diam: 3.00 cm MITRAL VALVE               TRICUSPID VALVE MV Area (PHT): 5.97 cm    TR Peak grad:   24.6 mmHg MV Decel Time: 127 msec    TR Vmax:        248.00 cm/s MV E velocity: 86.40 cm/s MV A velocity: 35.60 cm/s  SHUNTS MV E/A ratio:  2.43        Systemic VTI:  0.17 m                            Systemic Diam: 2.00 cm Donato Schultz MD Electronically signed by Donato Schultz MD Signature Date/Time: 12/12/2019/11:43:45 AM    Final    VAS US CAROTID  Result Date: 12/12/2019 Carotid Arterial Duplex Study Indications:       CVA. Risk Factors:      Hypertension. Comparison Study:  No prior studies. Performing Technologist: Jean Rosenthal  Examination Guidelines: A complete evaluation includes B-mode imaging, spectral Doppler, color Doppler, and power Doppler as needed of all accessible portions of each vessel. Bilateral testing is considered an integral part of a complete examination. Limited examinations for reoccurring  indications may be performed as noted.  Right Carotid Findings: +----------+--------+--------+--------+------------------+------------------+             PSV cm/s EDV cm/s Stenosis Plaque Description Comments            +----------+--------+--------+--------+------------------+------------------+  CCA Prox   62       11                                   intimal thickening  +----------+--------+--------+--------+------------------+------------------+  CCA Distal 65       16                                   intimal thickening  +----------+--------+--------+--------+------------------+------------------+  ICA Prox   67       29       1-39%    heterogenous                           +----------+--------+--------+--------+------------------+------------------+  ICA Distal 68       25                                                        +----------+--------+--------+--------+------------------+------------------+  ECA        110      19                                                       +----------+--------+--------+--------+------------------+------------------+ +----------+--------+-------+----------------+-------------------+             PSV cm/s EDV cms Describe         Arm Pressure (mmHG)  +----------+--------+-------+----------------+-------------------+  Subclavian 61               Multiphasic, WNL                      +----------+--------+-------+----------------+-------------------+ +---------+--------+--+--------+--+---------+  Vertebral PSV cm/s 68 EDV cm/s 22 Antegrade  +---------+--------+--+--------+--+---------+  Left Carotid Findings: +----------+--------+--------+--------+------------------+------------------+             PSV cm/s EDV cm/s Stenosis Plaque Description Comments            +----------+--------+--------+--------+------------------+------------------+  CCA Prox   124      27                                   intimal thickening  +----------+--------+--------+--------+------------------+------------------+  CCA Distal 71       19                                   intimal thickening  +----------+--------+--------+--------+------------------+------------------+  ICA Prox   114      38       1-39%    heterogenous                           +----------+--------+--------+--------+------------------+------------------+  ICA Distal 105      37                                                       +----------+--------+--------+--------+------------------+------------------+  ECA        75       14                                                       +----------+--------+--------+--------+------------------+------------------+ +----------+--------+--------+----------------+-------------------+             PSV cm/s EDV cm/s Describe         Arm Pressure (mmHG)  +----------+--------+--------+----------------+-------------------+   Subclavian 82                Multiphasic, WNL                      +----------+--------+--------+----------------+-------------------+ +---------+--------+--+--------+--+---------+  Vertebral PSV cm/s 37 EDV cm/s 17 Antegrade  +---------+--------+--+--------+--+---------+   Summary: Right Carotid: Velocities in the right ICA are consistent with a 1-39% stenosis. Left Carotid: Velocities in the left ICA are consistent with a 1-39% stenosis. Vertebrals:  Bilateral vertebral arteries demonstrate antegrade flow. Subclavians: Normal flow hemodynamics were seen in bilateral subclavian              arteries. *See table(s) above for measurements and observations.     Preliminary    VAS [place] LOWER EXTREMITY VENOUS (DVT)  Result Date: 12/12/2019  Lower Venous DVTStudy Indications: Stroke.  Comparison Study: No prior studies. Performing Technologist: Jean Rosenthal  Examination Guidelines: A complete evaluation includes B-mode imaging, spectral Doppler, color Doppler, and power Doppler as needed of all accessible portions of each vessel. Bilateral testing is considered an integral part of a complete examination. Limited examinations for reoccurring indications may be performed as noted. The reflux portion of the exam is performed with the patient in reverse Trendelenburg.  +---------+---------------+---------+-----------+----------+--------------+  RIGHT     Compressibility Phasicity Spontaneity Properties Thrombus Aging  +---------+---------------+---------+-----------+----------+--------------+  CFV       Full            Yes       Yes                                    +---------+---------------+---------+-----------+----------+--------------+  SFJ       Full            Yes       Yes                                    +---------+---------------+---------+-----------+----------+--------------+  FV Prox   Full                                                              +---------+---------------+---------+-----------+----------+--------------+  FV Mid  Full                                                             +---------+---------------+---------+-----------+----------+--------------+  FV Distal Full                                                             +---------+---------------+---------+-----------+----------+--------------+  PFV       Full                                                             +---------+---------------+---------+-----------+----------+--------------+  POP       Full            Yes       Yes                                    +---------+---------------+---------+-----------+----------+--------------+  PTV       Full                                                             +---------+---------------+---------+-----------+----------+--------------+  PERO      Full                                                             +---------+---------------+---------+-----------+----------+--------------+   +---------+---------------+---------+-----------+----------+--------------+  LEFT      Compressibility Phasicity Spontaneity Properties Thrombus Aging  +---------+---------------+---------+-----------+----------+--------------+  CFV       Full            Yes       Yes                                    +---------+---------------+---------+-----------+----------+--------------+  SFJ       Full            Yes       Yes                                    +---------+---------------+---------+-----------+----------+--------------+  FV Prox   Full                                                             +---------+---------------+---------+-----------+----------+--------------+  FV Mid    Full                                                             +---------+---------------+---------+-----------+----------+--------------+  FV Distal Full                                                              +---------+---------------+---------+-----------+----------+--------------+  PFV       Full                                                             +---------+---------------+---------+-----------+----------+--------------+  POP       Full            Yes       Yes                                    +---------+---------------+---------+-----------+----------+--------------+  PTV       Full                                                             +---------+---------------+---------+-----------+----------+--------------+  PERO      Full                                                             +---------+---------------+---------+-----------+----------+--------------+     Summary: RIGHT: - There is no evidence of deep vein thrombosis in the lower extremity.  - No cystic structure found in the popliteal fossa.  LEFT: - There is no evidence of deep vein thrombosis in the lower extremity.  - No cystic structure found in the popliteal fossa.  *See table(s) above for measurements and observations. Electronically signed by Lemar Livings MD on 12/12/2019 at 11:20:08 AM.    Final    Scheduled Meds:  amLODipine  10 mg Oral Daily   aspirin  81 mg Oral Daily   [START ON 12/13/2019] atorvastatin  40 mg Oral Daily   Chlorhexidine Gluconate Cloth  6 each Topical Daily   clopidogrel  75 mg Oral Daily   folic acid  1 mg Oral Daily   heparin  5,000 Units Subcutaneous Q8H   hydrALAZINE  25 mg Oral Q8H   [START ON 12/13/2019] metoprolol succinate  100 mg Oral Daily   multivitamin with minerals  1 tablet Oral Daily   nicotine  7 mg Transdermal Daily   thiamine  100 mg Oral Daily   Continuous Infusions:  LOS: 3 days   Merlene Laughter, DO [facility] PAGER is on AMION  If 7PM-7AM, please contact night-coverage [email or web address]

## 2019-12-12 NOTE — Consult Note (Signed)
Stroke Neurology Consultation Note  Consult Requested by: Cheri Fowler, MD, PCCM   Reason for Consult: L MCA punctate infarct in pt w/ hypertensive emergency, flash pulmonary edema and AKI  Consult Date:  12/12/19  The history was obtained from the patient and father.  During history and examination, all items were able to obtain unless otherwise noted.  History of Present Illness:  Gerald Morgan is an 59 y.o. African American male with PMH of alcohol abuse, smoker and SDH s/p crani x 2 presenting to [facility] [facility] ED with dizziness and incoordination. Developed SOB in the ED with desaturation requiring BiPaP after IV fluid. Diuresed flash pulmonary edema. With acute kidney injury. Admitted to ICU on cardene by PCCM due to hypertensive emergency. BP meds adjusted. CT without hemorrhage.  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  Neurology was consulted.  I saw patient with his father is at the bedside.  Patient lying in bed, no complaints.  Admitted that he smokes 1 pack/day, drink 40 ounces 2-3 times a week.  Neurologically intact, however BP still high.  His MRI head also showed confluent bilateral leukoauraiosis, consistent with severe ischemic vitamin or changes in the setting of multiple uncontrolled risk factors, including smoking, heavy alcohol, HLD, HTN, mild cardiomyopathy.  Date last known well: Unable to determine, likely 3 days ago tPA Given: No: Outside window  Past Medical History:  Diagnosis Date  . Hypertension      Past Surgical History:  Procedure Laterality Date  . BRAIN SURGERY    . CRANIOTOMY  09/19/2011   Procedure: CRANIOTOMY HEMATOMA EVACUATION EPIDURAL;  Surgeon: Barnett Abu, MD;  Location: [facility];  Service: Neurosurgery;  Laterality: Left;  Redo craniotomy for evacuation of epidural hematoma  . CRANIOTOMY Left 04/17/2014   Procedure: CRANIOTOMY HEMATOMA EVACUATION SUBDURAL;  Surgeon: Tressie Stalker, MD;  Location: [facility];   Service: Neurosurgery;  Laterality: Left;  left    History reviewed. No pertinent family history.   Social History:  reports that he has been smoking cigarettes. He has been smoking about 0.50 packs per day. He has never used smokeless tobacco. He reports current alcohol use of about 2.0 standard drinks of alcohol per week. He reports current drug use. Drug: Marijuana.  Review of Systems: A full ROS was attempted today and was able to be performed.  Systems assessed include - Constitutional, Eyes, HENT, Respiratory, Cardiovascular, Gastrointestinal, Genitourinary, Integument/breast, Hematologic/lymphatic, Musculoskeletal, Neurological, Behavioral/Psych, Endocrine, Allergic/Immunologic - with pertinent responses as per HPI.  Allergies: No Known Allergies   Medications: I have reviewed the patient's current medications.  Test Results: CBC:  Recent Labs  Lab 12/11/19 0918 12/12/19 0413  WBC 6.3 6.6  NEUTROABS 3.7 4.3  HGB 14.9 14.3  HCT 45.1 43.9  MCV 93.4 93.2  PLT 307 300   Basic Metabolic Panel:  Recent Labs  Lab 12/11/19 0918 12/12/19 0413  NA 139 140  K 4.0 3.5  CL 106 106  CO2 24 22  GLUCOSE 102* 105*  BUN 14 14  CREATININE 1.99* 1.94*  CALCIUM 9.1 9.2  MG 1.9 1.7  PHOS 2.2* 3.7   Liver Function Tests: Recent Labs  Lab 12/11/19 0918 12/12/19 0413  AST 18 15  ALT 10 11  ALKPHOS 74 68  BILITOT 0.6 0.8  PROT 7.2 6.9  ALBUMIN 3.6 3.4*   No results for input(s): LIPASE, AMYLASE in the last 168 hours. No results for input(s): AMMONIA in the last 168 hours. Coagulation Studies:  No results for input(s): LABPROT, INR in the last 72 hours. Cardiac Enzymes: No results for input(s): CKTOTAL, CKMB, CKMBINDEX, TROPONINI in the last 168 hours. BNP: Invalid input(s): POCBNP CBG:  Recent Labs  Lab 12/08/19 1232 12/09/19 1200  GLUCAP 106* 161*   Urinalysis:  Recent Labs  Lab 12/08/19 2108  COLORURINE YELLOW  LABSPEC 1.016  PHURINE 5.0  GLUCOSEU 50*  HGBUR  SMALL*  BILIRUBINUR NEGATIVE  KETONESUR NEGATIVE  PROTEINUR 100*  NITRITE NEGATIVE  LEUKOCYTESUR NEGATIVE   Microbiology:  Results for orders placed or performed during the hospital encounter of 12/08/19  SARS Coronavirus 2 by RT PCR (hospital order, performed in [facility] hospital lab) Nasopharyngeal Nasopharyngeal Swab     Status: None   Collection Time: 12/09/19 12:42 AM   Specimen: Nasopharyngeal Swab  Result Value Ref Range Status   SARS Coronavirus 2 NEGATIVE NEGATIVE Final    Comment: (NOTE) SARS-CoV-2 target nucleic acids are NOT DETECTED.  The SARS-CoV-2 RNA is generally detectable in upper and lower respiratory specimens during the acute phase of infection. The lowest concentration of SARS-CoV-2 viral copies this assay can detect is 250 copies / mL. A negative result does not preclude SARS-CoV-2 infection and should not be used as the sole basis for treatment or other patient management decisions.  A negative result may occur with improper specimen collection / handling, submission of specimen other than nasopharyngeal swab, presence of viral mutation(s) within the areas targeted by this assay, and inadequate number of viral copies (<250 copies / mL). A negative result must be combined with clinical observations, patient history, and epidemiological information.  Fact Sheet for Patients:   BoilerBrush.com.cy  Fact Sheet for Healthcare Providers: https://pope.com/  This test is not yet approved or  cleared by the [place] FDA and has been authorized for detection and/or diagnosis of SARS-CoV-2 by FDA under an Emergency Use Authorization (EUA).  This EUA will remain in effect (meaning this test can be used) for the duration of the COVID-19 declaration under Section 564(b)(1) of the Act, 21 U.S.C. section 360bbb-3(b)(1), unless the authorization is terminated or revoked sooner.  Performed at [facility]  Lab, [address], [place], [place] [address]   MRSA PCR Screening     Status: None   Collection Time: 12/09/19 12:06 PM   Specimen: Nasopharyngeal  Result Value Ref Range Status   MRSA by PCR NEGATIVE NEGATIVE Final    Comment:        The GeneXpert MRSA Assay (FDA approved for NASAL specimens only), is one component of a comprehensive MRSA colonization surveillance program. It is not intended to diagnose MRSA infection nor to guide or monitor treatment for MRSA infections. Performed at [facility], [address], [place], [place] [address]    Lipid Panel:     Component Value Date/Time   CHOL 227 (H) 12/12/2019 0413   TRIG 108 12/12/2019 0413   HDL 46 12/12/2019 0413   CHOLHDL 4.9 12/12/2019 0413   VLDL 22 12/12/2019 0413   LDLCALC 159 (H) 12/12/2019 0413   HgbA1c:  Lab Results  Component Value Date   HGBA1C 5.5 12/12/2019   Urine Drug Screen:     Component Value Date/Time   LABOPIA NONE DETECTED 12/09/2019 0045   COCAINSCRNUR NONE DETECTED 12/09/2019 0045   LABBENZ NONE DETECTED 12/09/2019 0045   AMPHETMU NONE DETECTED 12/09/2019 0045   THCU POSITIVE (A) 12/09/2019 0045   LABBARB NONE DETECTED 12/09/2019 0045    Alcohol Level: No results  for input(s): ETH in the last 168 hours.  CT Head Wo Contrast  Result Date: 12/08/2019 CLINICAL DATA:  Dizziness EXAM: CT HEAD WITHOUT CONTRAST TECHNIQUE: Contiguous axial images were obtained from the base of the skull through the vertex without intravenous contrast. COMPARISON:  CT 04/18/2014 FINDINGS: Brain: No acute territorial infarction, hemorrhage, or intracranial mass. Encephalomalacia in the left greater than right frontal lobes. Moderate hypodensity in the white matter. 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 The ventricles are nonenlarged. Vascular: No hyperdense vessels.  Carotid vascular calcification Skull: Left craniotomy.  No acute fracture. Sinuses/Orbits: Mild mucosal  thickening in the sinuses. Other: None IMPRESSION: 1. No CT evidence for acute intracranial abnormality. 2. Encephalomalacia in the left greater than right frontal lobe. 3. Moderate bilateral white matter hypodensity, possible age advanced chronic small vessel ischemic change though other white matter disease is not excluded. Further evaluation with MRI could be considered, which could be performed non emergently unless clinical situation dictates otherwise. Electronically Signed   By: Jasmine Pang M.D.   On: 12/08/2019 21:20   MR ANGIO HEAD WO CONTRAST  Result Date: 12/12/2019 CLINICAL DATA:  Dizziness. Alcohol abuse. Small acute infarctions in the left MCA territory shown by MRI 3 days ago. EXAM: MRA HEAD WITHOUT CONTRAST TECHNIQUE: Angiographic images of the Circle of Willis were obtained using MRA technique without intravenous contrast. COMPARISON:  MRI 12/09/2019. FINDINGS: Both internal carotid arteries are widely patent through the skull base and siphon regions. The anterior and middle cerebral vessels are patent without proximal stenosis, aneurysm or vascular malformation. Both vertebral arteries are patent to the basilar. No basilar stenosis. 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 IMPRESSION: 1. No intracranial large or medium vessel occlusion. No anterior circulation stenosis. 2. 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 Electronically Signed   By: Paulina Fusi M.D.   On: 12/12/2019 08:23   MR BRAIN WO CONTRAST  Result Date: 12/09/2019 CLINICAL DATA:  Dizziness, alcohol abuse EXAM: MRI HEAD WITHOUT CONTRAST TECHNIQUE: Multiplanar, multiecho pulse sequences of the brain and surrounding structures were obtained without intravenous contrast. COMPARISON:  None. FINDINGS: Brain: A few punctate foci of cortical/subcortical reduced diffusion are present in the left frontoparietal lobes and left parietotemporal junction. Anterior  inferior left frontal encephalomalacia. Small central pontine infarcts. Confluent areas of T2 hyperintensity in the supratentorial and pontine white matter are nonspecific but may reflect advanced chronic microvascular ischemic changes. Involvement of the deep gray nuclei may reflect the same process or alternatively, together with pontine changes may reflect sequelae of osmotic demyelination. There is susceptibility along the left frontal convexity reflecting hemosiderin deposition from prior hemorrhage. Minimal additional scattered punctate foci of susceptibility likely reflect chronic microhemorrhages. There is no intracranial mass, mass effect, or edema. There is no hydrocephalus or extra-axial fluid collection. Vascular: Major vessel flow voids at the skull base are preserved. Skull and upper cervical spine: Evidence of prior wide left craniotomy. Normal marrow signal is preserved. Sinuses/Orbits: Paranasal sinus mucosal thickening primarily involving ethmoids and right sphenoid. Orbits are unremarkable. Other: Sella is unremarkable.  Mastoid air cells are clear. IMPRESSION: Few punctate acute left cerebral infarcts in the MCA territory. Advanced chronic microvascular ischemic changes. 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 Anterior inferior left frontal encephalomalacia. Sequelae of left extra-axial hemorrhage. Electronically Signed   By: Guadlupe Spanish M.D.   On: 12/09/2019 17:38   DG Chest [facility]  1 View  Result Date: 12/10/2019 CLINICAL DATA:  Respiratory failure. EXAM: PORTABLE CHEST 1 VIEW COMPARISON:  December 08, 2019. FINDINGS: Stable cardiomediastinal silhouette. No pneumothorax or pleural effusion is noted. Both lungs are clear. The visualized skeletal structures are unremarkable. IMPRESSION: No active disease. Electronically Signed   By: Lupita RaiderJames  Green Jr M.D.   On: 12/10/2019 08:07   DG Chest Port 1 View  Result Date: 12/08/2019 CLINICAL DATA:  Shortness of breath  EXAM: PORTABLE CHEST 1 VIEW COMPARISON:  None. FINDINGS: Cardiomegaly with vascular congestion. Diffuse interstitial opacity suspicious for pulmonary edema. No consolidation or effusion. No pneumothorax IMPRESSION: Cardiomegaly with vascular congestion and diffuse interstitial pulmonary edema Electronically Signed   By: Jasmine PangKim  Fujinaga M.D.   On: 12/08/2019 22:39   ECHOCARDIOGRAM COMPLETE  Result Date: 12/12/2019    ECHOCARDIOGRAM REPORT   Patient Name:   Barnett HatterVERIAN B Pro Date of Exam: 12/12/2019 Medical Rec #:  [id number]       Height:       69.0 in Accession #:    [id number]      Weight:       159.8 lb Date of Birth:  11/06/1960       BSA:          1.878 m Patient Age:    58 years        BP:           163/107 mmHg Patient Gender: M               HR:           76 bpm. Exam Location:  Inpatient Procedure: 2D Echo, Cardiac Doppler and Color Doppler Indications:    Stroke 434.91/ I163.9  History:        Patient has no prior history of Echocardiogram examinations.                 Risk Factors:Hypertension and Current Smoker.  Sonographer:    Renella CunasJulia Swaim RDCS Referring Phys: [id number] Wylder Macomber IMPRESSIONS  1. Left ventricular ejection fraction, by estimation, is 45 to 50%. The left ventricle has mildly decreased function. The left ventricle has no regional wall motion abnormalities. There is mild left ventricular hypertrophy. Left ventricular diastolic parameters are consistent with Grade II diastolic dysfunction (pseudonormalization).  2. Right ventricular systolic function is normal. The right ventricular size is normal. There is normal pulmonary artery systolic pressure. The estimated right ventricular systolic pressure is 27.6 mmHg.  3. The mitral valve is normal in structure. Trivial mitral valve regurgitation. No evidence of mitral stenosis.  4. The aortic valve is normal in structure. Aortic valve regurgitation is trivial. No aortic stenosis is present.  5. The inferior vena cava is normal in size with greater  than 50% respiratory variability, suggesting right atrial pressure of 3 mmHg. Conclusion(s)/Recommendation(s): No intracardiac source of embolism detected on this transthoracic study. A transesophageal echocardiogram is recommended to exclude cardiac source of embolism if clinically indicated. FINDINGS  Left Ventricle: Left ventricular ejection fraction, by estimation, is 45 to 50%. The left ventricle has mildly decreased function. The left ventricle has no regional wall motion abnormalities. The left ventricular internal cavity size was normal in size. There is mild left ventricular hypertrophy. Left ventricular diastolic parameters are consistent with Grade II diastolic dysfunction (pseudonormalization).  LV Wall Scoring: The anterior septum, basal inferior segment, and basal inferoseptal segment are akinetic. The mid inferior segment is hypokinetic. Right Ventricle: The right ventricular size is normal. No increase in right  ventricular wall thickness. Right ventricular systolic function is normal. There is normal pulmonary artery systolic pressure. The tricuspid regurgitant velocity is 2.48 m/s, and  with an assumed right atrial pressure of 3 mmHg, the estimated right ventricular systolic pressure is 27.6 mmHg. Left Atrium: Left atrial size was normal in size. Right Atrium: Right atrial size was normal in size. Pericardium: There is no evidence of pericardial effusion. Mitral Valve: The mitral valve is normal in structure. Normal mobility of the mitral valve leaflets. Trivial mitral valve regurgitation. No evidence of mitral valve stenosis. Tricuspid Valve: The tricuspid valve is normal in structure. Tricuspid valve regurgitation is trivial. No evidence of tricuspid stenosis. Aortic Valve: The aortic valve is normal in structure. Aortic valve regurgitation is trivial. No aortic stenosis is present. Pulmonic Valve: The pulmonic valve was normal in structure. Pulmonic valve regurgitation is not visualized. No  evidence of pulmonic stenosis. Aorta: The aortic root is normal in size and structure. Venous: The inferior vena cava is normal in size with greater than 50% respiratory variability, suggesting right atrial pressure of 3 mmHg. IAS/Shunts: No atrial level shunt detected by color flow Doppler.  LEFT VENTRICLE PLAX 2D LVIDd:         4.00 cm      Diastology LVIDs:         3.50 cm      LV e' lateral:   6.49 cm/s LV PW:         1.20 cm      LV E/e' lateral: 13.3 LV IVS:        1.20 cm      LV e' medial:    4.87 cm/s LVOT diam:     2.00 cm      LV E/e' medial:  17.7 LV SV:         53 LV SV Index:   28 LVOT Area:     3.14 cm  LV Volumes (MOD) LV vol d, MOD A2C: 125.0 ml LV vol d, MOD A4C: 133.0 ml LV vol s, MOD A2C: 67.1 ml LV vol s, MOD A4C: 64.3 ml LV SV MOD A2C:     57.9 ml LV SV MOD A4C:     133.0 ml LV SV MOD BP:      61.7 ml RIGHT VENTRICLE RV S prime:     9.49 cm/s TAPSE (M-mode): 1.8 cm LEFT ATRIUM             Index       RIGHT ATRIUM           Index LA diam:        3.90 cm 2.08 cm/m  RA Area:     14.70 cm LA Vol (A2C):   49.6 ml 26.41 ml/m RA Volume:   36.80 ml  19.59 ml/m LA Vol (A4C):   46.5 ml 24.76 ml/m LA Biplane Vol: 48.7 ml 25.93 ml/m  AORTIC VALVE LVOT Vmax:   106.00 cm/s LVOT Vmean:  67.600 cm/s LVOT VTI:    0.170 m  AORTA Ao Root diam: 3.00 cm MITRAL VALVE               TRICUSPID VALVE MV Area (PHT): 5.97 cm    TR Peak grad:   24.6 mmHg MV Decel Time: 127 msec    TR Vmax:        248.00 cm/s MV E velocity: 86.40 cm/s MV A velocity: 35.60 cm/s  SHUNTS MV E/A ratio:  2.43  Systemic VTI:  0.17 m                            Systemic Diam: 2.00 cm Donato Schultz MD Electronically signed by Donato Schultz MD Signature Date/Time: 12/12/2019/11:43:45 AM    Final    VAS US CAROTID  Result Date: 12/12/2019 Carotid Arterial Duplex Study Indications:       CVA. Risk Factors:      Hypertension. Comparison Study:  No prior studies. Performing Technologist: Jean Rosenthal  Examination Guidelines: A complete  evaluation includes B-mode imaging, spectral Doppler, color Doppler, and power Doppler as needed of all accessible portions of each vessel. Bilateral testing is considered an integral part of a complete examination. Limited examinations for reoccurring indications may be performed as noted.  Right Carotid Findings: +----------+--------+--------+--------+------------------+------------------+           PSV cm/sEDV cm/sStenosisPlaque DescriptionComments           +----------+--------+--------+--------+------------------+------------------+ CCA Prox  62      11                                intimal thickening +----------+--------+--------+--------+------------------+------------------+ CCA Distal65      16                                intimal thickening +----------+--------+--------+--------+------------------+------------------+ ICA Prox  67      29      1-39%   heterogenous                         +----------+--------+--------+--------+------------------+------------------+ ICA Distal68      25                                                   +----------+--------+--------+--------+------------------+------------------+ ECA       110     19                                                   +----------+--------+--------+--------+------------------+------------------+ +----------+--------+-------+----------------+-------------------+           PSV cm/sEDV cmsDescribe        Arm Pressure (mmHG) +----------+--------+-------+----------------+-------------------+ [id number]             Multiphasic, WNL                    +----------+--------+-------+----------------+-------------------+ +---------+--------+--+--------+--+---------+ VertebralPSV cm/s68EDV cm/s22Antegrade +---------+--------+--+--------+--+---------+  Left Carotid Findings: +----------+--------+--------+--------+------------------+------------------+           PSV cm/sEDV  cm/sStenosisPlaque DescriptionComments           +----------+--------+--------+--------+------------------+------------------+ CCA Prox  124     27                                intimal thickening +----------+--------+--------+--------+------------------+------------------+ CCA Distal71      19  intimal thickening +----------+--------+--------+--------+------------------+------------------+ ICA Prox  114     38      1-39%   heterogenous                         +----------+--------+--------+--------+------------------+------------------+ ICA Distal105     37                                                   +----------+--------+--------+--------+------------------+------------------+ ECA       75      14                                                   +----------+--------+--------+--------+------------------+------------------+ +----------+--------+--------+----------------+-------------------+           PSV cm/sEDV cm/sDescribe        Arm Pressure (mmHG) +----------+--------+--------+----------------+-------------------+ [id number]              Multiphasic, WNL                    +----------+--------+--------+----------------+-------------------+ +---------+--------+--+--------+--+---------+ VertebralPSV cm/s37EDV cm/s17Antegrade +---------+--------+--+--------+--+---------+   Summary: Right Carotid: Velocities in the right ICA are consistent with a 1-39% stenosis. Left Carotid: Velocities in the left ICA are consistent with a 1-39% stenosis. Vertebrals:  Bilateral vertebral arteries demonstrate antegrade flow. Subclavians: Normal flow hemodynamics were seen in bilateral subclavian              arteries. *See table(s) above for measurements and observations.     Preliminary    VAS [place] LOWER EXTREMITY VENOUS (DVT)  Result Date: 12/12/2019  Lower Venous DVTStudy Indications: Stroke.  Comparison Study: No prior studies.  Performing Technologist: Jean Rosenthal  Examination Guidelines: A complete evaluation includes B-mode imaging, spectral Doppler, color Doppler, and power Doppler as needed of all accessible portions of each vessel. Bilateral testing is considered an integral part of a complete examination. Limited examinations for reoccurring indications may be performed as noted. The reflux portion of the exam is performed with the patient in reverse Trendelenburg.  +---------+---------------+---------+-----------+----------+--------------+ RIGHT    CompressibilityPhasicitySpontaneityPropertiesThrombus Aging +---------+---------------+---------+-----------+----------+--------------+ CFV      Full           Yes      Yes                                 +---------+---------------+---------+-----------+----------+--------------+ SFJ      Full           Yes      Yes                                 +---------+---------------+---------+-----------+----------+--------------+ FV Prox  Full                                                        +---------+---------------+---------+-----------+----------+--------------+ FV Mid   Full                                                        +---------+---------------+---------+-----------+----------+--------------+  FV DistalFull                                                        +---------+---------------+---------+-----------+----------+--------------+ PFV      Full                                                        +---------+---------------+---------+-----------+----------+--------------+ POP      Full           Yes      Yes                                 +---------+---------------+---------+-----------+----------+--------------+ PTV      Full                                                        +---------+---------------+---------+-----------+----------+--------------+ PERO     Full                                                         +---------+---------------+---------+-----------+----------+--------------+   +---------+---------------+---------+-----------+----------+--------------+ LEFT     CompressibilityPhasicitySpontaneityPropertiesThrombus Aging +---------+---------------+---------+-----------+----------+--------------+ CFV      Full           Yes      Yes                                 +---------+---------------+---------+-----------+----------+--------------+ SFJ      Full           Yes      Yes                                 +---------+---------------+---------+-----------+----------+--------------+ FV Prox  Full                                                        +---------+---------------+---------+-----------+----------+--------------+ FV Mid   Full                                                        +---------+---------------+---------+-----------+----------+--------------+ FV DistalFull                                                        +---------+---------------+---------+-----------+----------+--------------+  PFV      Full                                                        +---------+---------------+---------+-----------+----------+--------------+ POP      Full           Yes      Yes                                 +---------+---------------+---------+-----------+----------+--------------+ PTV      Full                                                        +---------+---------------+---------+-----------+----------+--------------+ PERO     Full                                                        +---------+---------------+---------+-----------+----------+--------------+     Summary: RIGHT: - There is no evidence of deep vein thrombosis in the lower extremity.  - No cystic structure found in the popliteal fossa.  LEFT: - There is no evidence of deep vein thrombosis in the lower extremity.  - No cystic structure found  in the popliteal fossa.  *See table(s) above for measurements and observations. Electronically signed by Lemar Livings MD on 12/12/2019 at 11:20:08 AM.    Final      EKG: normal sinus rhythm.   Physical Examination: Temp:  [97.5 F (36.4 C)-98.2 F (36.8 C)] 97.6 F (36.4 C) (08/27 1524) Pulse Rate:  [72-99] 81 (08/27 1524) Resp:  [16-19] 19 (08/27 1524) BP: (163-194)/(100-118) 165/107 (08/27 1524) SpO2:  [99 %-100 %] 99 % (08/27 1524)  General - Well nourished, well developed, in no apparent distress.  Ophthalmologic - fundi not visualized due to noncooperation.  Cardiovascular - Regular rhythm and rate.  Mental Status -  Level of arousal and orientation to time, place, and person were intact. Language including expression, naming, repetition, comprehension was assessed and found intact.  Mild psychomotor slowing Fund of Knowledge was assessed and was intact.  Cranial Nerves II - XII - II - Visual field intact OU. III, IV, VI - Extraocular movements intact. V - Facial sensation intact bilaterally. VII - Facial movement intact bilaterally. VIII - Hearing & vestibular intact bilaterally. X - Palate elevates symmetrically. XI - Chin turning & shoulder shrug intact bilaterally. XII - Tongue protrusion intact.  Motor Strength - The patient's strength was normal in all extremities and pronator drift was absent.  Bulk was normal and fasciculations were absent.   Motor Tone - Muscle tone was assessed at the neck and appendages and was normal.  Reflexes - The patient's reflexes were symmetrical in all extremities and he had no pathological reflexes.  Sensory - Light touch, temperature/pinprick were assessed and were symmetrical.    Coordination - The patient had normal movements in the hands with no ataxia or dysmetria.  Tremor was absent.  Gait and Station -  deferred.   Assessment:  Gerald Morgan is a 59 y.o. male with history of alcohol use and SDH s/p crani x  3 found to have L MCA territory infarcts during admission for hypertensive emergency and flash pulmonary edema.   Stroke:   Few L punctate L MCA territory infarcts, likely due to synchronize small vessel disease given multiple uncontrolled risk factors.  Less likely cardioembolic or vasculitis.  CT head No acute abnormality. L>R encephalomalacia. Moderate B white matter hypodensity.   MRI  Few punctate L MCA territory infarcts. Advanced small vessel disease with confluent leukoariaosis. Anterior inferior L frontal lobe encephalomalacia. 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 No source of embolus   Consider 30-day CardioNet monitor as outpatient to rule out A. fib  LDL 159  HgbA1c 5.5   Hypercoagulable work-up pending  Heparin 5000 units sq tid for VTE prophylaxis  No antithrombotic prior to admission, now on aspirin 81 mg daily and Plavix 75 DAPT.  Continue DAPT for 3 weeks and then aspirin alone.  Therapy recommendations:  No OT, no PT  Disposition:  Return home  Hypertensive Emergency complicated w/ Pulmonary Edema  Treated w/ cardene, now off . Gradually normalize BP in 2 to 3 days . BP stable but on the higher end . Currently on Norvasc, metoprolol and hydralazine . 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  MRI this admission showed anterior inferior L frontal lobe encephalomalacia. 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  Neurology will sign off. Please call with questions. Pt will follow up with stroke clinic NP at [facility] in about 4 weeks. Thanks for the consult.  Marvel Plan, MD PhD Stroke Neurology 12/12/2019 4:19 PM   To contact Stroke Continuity provider, please refer to [email or web address]. After hours, contact General Neurology

## 2019-12-12 NOTE — TOC CAGE-AID Note (Signed)
Transition of Care [facility]) - CAGE-AID Screening   Patient Details  Name: AASHISH HAMM MRN: [id number] Date of Birth: 1961-04-14  Transition of Care [facility]) CM/SW Contact:    Emeterio Reeve, [place] Phone Number: 12/12/2019, 4:15 PM   Clinical Narrative: CSW met with pt at bedside. CSW introduced self and explained her role at the hospital.  Pt reports alcohol use most days of the week and occasional marijuana use. Pt stated that he does not think his alcohol use is an issue but accepted resources.    CAGE-AID Screening:    Have You Ever Felt You Ought to Cut Down on Your Drinking or Drug Use?: Yes Have People Annoyed You By Critizing Your Drinking Or Drug Use?: No Have You Felt Bad Or Guilty About Your Drinking Or Drug Use?: No Have You Ever Had a Drink or Used Drugs First Thing In The Morning to Steady Your Nerves or to Get Rid of a Hangover?: No CAGE-AID Score: 1  Substance Abuse Education Offered: Yes  Substance abuse interventions: Patient Counseling, Educational Materials  Emeterio Reeve, Latanya Presser, [place] Social Worker [phone]

## 2019-12-12 NOTE — Evaluation (Signed)
Occupational Therapy Evaluation and Discharge Patient Details Name: Gerald Morgan MRN: [id number] DOB: 07/19/1960 Today's Date: 12/12/2019    History of Present Illness Patient is a 59 year old male brought to the emergency room with complaints of dizziness and difficult coordination.  He is a regular drinker.  He received a liter of IV fluid the emergency room and developed worsening dyspnea with decrease in oxyFew punctate acute left cerebral infarcts in the MCA territory. Advanced chronic microvascular ischemic changes. 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 Anterior inferior left frontal encephalomalacia. Sequelae of left extra-axial hemorrhage.gen saturation requiring BiPAP.MRI:   Clinical Impression   This 59 yo male admitted with above presents to acute OT at an independent level for basic ADLs in this environment and not foresee any issues when he is at home. Acute OT will sign off.    Follow Up Recommendations  No OT follow up    Equipment Recommendations  None recommended by OT       Precautions / Restrictions Precautions Precautions: Fall Precaution Comments: mod fall Restrictions Weight Bearing Restrictions: No      Mobility Bed Mobility Overal bed mobility: Independent                Transfers Overall transfer level: Independent Equipment used: None Transfers: Sit to/from Stand Sit to Stand: Supervision              Balance Overall balance assessment: Mild deficits observed, not formally tested (Occassionally off balance when up on his feet but no LOB) Sitting-balance support: Feet supported Sitting balance-Leahy Scale: Good     Standing balance support: No upper extremity supported;During functional activity Standing balance-Leahy Scale: Fair Standing balance comment: mildly unsteady, no lob.                           ADL either performed or assessed with clinical judgement   ADL Overall ADL's :  Independent                                             Vision Baseline Vision/History: No visual deficits Patient Visual Report: No change from baseline Vision Assessment?: Yes Eye Alignment: Within Functional Limits Ocular Range of Motion: Within Functional Limits Alignment/Gaze Preference: Within Defined Limits Tracking/Visual Pursuits: Able to track stimulus in all quads without difficulty Saccades: Within functional limits Convergence: Within functional limits Visual Fields: No apparent deficits            Pertinent Vitals/Pain Pain Assessment: No/denies pain     Hand Dominance Right   Extremity/Trunk Assessment Upper Extremity Assessment Upper Extremity Assessment: Overall WFL for tasks assessed     Communication Communication Communication: No difficulties   Cognition Arousal/Alertness: Awake/alert Behavior During Therapy: WFL for tasks assessed/performed Overall Cognitive Status: Within Functional Limits for tasks assessed                                                Home Living Family/patient expects to be discharged to:: Private residence Living Arrangements: Parent (his dad) Available Help at Discharge: Family;Available 24 hours/day Type of Home: House Home Access: Stairs to enter [employer] of Steps: 2 Entrance Stairs-Rails: None Home Layout: One level  Bathroom Shower/Tub: Doctor, general practice: None          Prior Functioning/Environment Level of Independence: Independent                 OT Problem List: Impaired balance (sitting and/or standing)         OT Goals(Current goals can be found in the care plan section) Acute Rehab OT Goals Patient Stated Goal: to go home soon  OT Frequency:                AM-PAC OT "6 Clicks" Daily Activity     Outcome Measure Help from another person eating meals?: None Help from another person  taking care of personal grooming?: None Help from another person toileting, which includes using toliet, bedpan, or urinal?: None Help from another person bathing (including washing, rinsing, drying)?: None Help from another person to put on and taking off regular upper body clothing?: None Help from another person to put on and taking off regular lower body clothing?: None 6 Click Score: 24   End of Session    Activity Tolerance: Patient tolerated treatment well Patient left: in bed;with call bell/phone within reach;with bed alarm set  OT Visit Diagnosis: Unsteadiness on feet (R26.81)                Time: 7494-4967 OT Time Calculation (min): 9 min Charges:  OT General Charges $OT Visit: 1 Visit OT Evaluation $OT Eval Low Complexity: 1 Low  Ignacia Palma, OTR/L Acute [employer] Pager [phone] Office [phone]     Evette Georges 12/12/2019, 3:28 PM

## 2019-12-12 NOTE — Progress Notes (Signed)
Lower extremity venous bilateral and Carotid duplex bilateral study completed.   Please see CV Proc for preliminary results.   Gerald Morgan

## 2019-12-12 NOTE — Progress Notes (Signed)
  Echocardiogram 2D Echocardiogram has been performed.  Gerald Morgan 12/12/2019, 9:16 AM

## 2019-12-13 ENCOUNTER — Other Ambulatory Visit: Payer: Self-pay | Admitting: Medical

## 2019-12-13 DIAGNOSIS — I6330 Cerebral infarction due to thrombosis of unspecified cerebral artery: Secondary | ICD-10-CM

## 2019-12-13 LAB — LUPUS ANTICOAGULANT PANEL
DRVVT: 41 s (ref 0.0–47.0)
PTT Lupus Anticoagulant: 34.5 s (ref 0.0–51.9)

## 2019-12-13 LAB — CBC WITH DIFFERENTIAL/PLATELET
Abs Immature Granulocytes: 0.02 10*3/uL (ref 0.00–0.07)
Basophils Absolute: 0 10*3/uL (ref 0.0–0.1)
Basophils Relative: 1 %
Eosinophils Absolute: 0.3 10*3/uL (ref 0.0–0.5)
Eosinophils Relative: 5 %
HCT: 43.5 % (ref 39.0–52.0)
Hemoglobin: 14.3 g/dL (ref 13.0–17.0)
Immature Granulocytes: 0 %
Lymphocytes Relative: 25 %
Lymphs Abs: 1.6 10*3/uL (ref 0.7–4.0)
MCH: 30.9 pg (ref 26.0–34.0)
MCHC: 32.9 g/dL (ref 30.0–36.0)
MCV: 94 fL (ref 80.0–100.0)
Monocytes Absolute: 0.7 10*3/uL (ref 0.1–1.0)
Monocytes Relative: 11 %
Neutro Abs: 3.7 10*3/uL (ref 1.7–7.7)
Neutrophils Relative %: 58 %
Platelets: 315 10*3/uL (ref 150–400)
RBC: 4.63 MIL/uL (ref 4.22–5.81)
RDW: 13.6 % (ref 11.5–15.5)
WBC: 6.4 10*3/uL (ref 4.0–10.5)
nRBC: 0 % (ref 0.0–0.2)

## 2019-12-13 LAB — COMPREHENSIVE METABOLIC PANEL
ALT: 10 U/L (ref 0–44)
AST: 16 U/L (ref 15–41)
Albumin: 3.3 g/dL — ABNORMAL LOW (ref 3.5–5.0)
Alkaline Phosphatase: 78 U/L (ref 38–126)
Anion gap: 11 (ref 5–15)
BUN: 15 mg/dL (ref 6–20)
CO2: 22 mmol/L (ref 22–32)
Calcium: 9.1 mg/dL (ref 8.9–10.3)
Chloride: 105 mmol/L (ref 98–111)
Creatinine, Ser: 1.73 mg/dL — ABNORMAL HIGH (ref 0.61–1.24)
GFR calc Af Amer: 50 mL/min — ABNORMAL LOW (ref >60)
GFR calc non Af Amer: 43 mL/min — ABNORMAL LOW (ref >60)
Glucose, Bld: 104 mg/dL — ABNORMAL HIGH (ref 70–99)
Potassium: 4 mmol/L (ref 3.5–5.1)
Sodium: 138 mmol/L (ref 135–145)
Total Bilirubin: 0.5 mg/dL (ref 0.3–1.2)
Total Protein: 6.9 g/dL (ref 6.5–8.1)

## 2019-12-13 LAB — PHOSPHORUS: Phosphorus: 4.9 mg/dL — ABNORMAL HIGH (ref 2.5–4.6)

## 2019-12-13 LAB — BETA-2-GLYCOPROTEIN I ABS, IGG/M/A
Beta-2 Glyco I IgG: <9 GPI IgG units (ref 0–20)
Beta-2-Glycoprotein I IgA: <9 GPI IgA units (ref 0–25)
Beta-2-Glycoprotein I IgM: 10 GPI IgM units (ref 0–32)

## 2019-12-13 LAB — MAGNESIUM: Magnesium: 2 mg/dL (ref 1.7–2.4)

## 2019-12-13 LAB — HOMOCYSTEINE: Homocysteine: 26.5 umol/L — ABNORMAL HIGH (ref 0.0–14.5)

## 2019-12-13 MED ORDER — METOPROLOL SUCCINATE ER 100 MG PO TB24
100.0000 mg | ORAL_TABLET | Freq: Every day | ORAL | 0 refills | Status: DC
Start: 2019-12-14 — End: 2020-01-20

## 2019-12-13 MED ORDER — FOLIC ACID 1 MG PO TABS
1.0000 mg | ORAL_TABLET | Freq: Every day | ORAL | 0 refills | Status: DC
Start: 2019-12-14 — End: 2020-02-03

## 2019-12-13 MED ORDER — NICOTINE 7 MG/24HR TD PT24: 7.0000 mg | MEDICATED_PATCH | Freq: Every day | TRANSDERMAL | 0 refills | Status: DC

## 2019-12-13 MED ORDER — POLYETHYLENE GLYCOL 3350 17 G PO PACK: 17.0000 g | PACK | Freq: Every day | ORAL | 0 refills | Status: DC | PRN

## 2019-12-13 MED ORDER — CLOPIDOGREL BISULFATE 75 MG PO TABS
75.0000 mg | ORAL_TABLET | Freq: Every day | ORAL | 0 refills | Status: DC
Start: 2019-12-14 — End: 2020-01-20

## 2019-12-13 MED ORDER — ATORVASTATIN CALCIUM 40 MG PO TABS
40.0000 mg | ORAL_TABLET | Freq: Every day | ORAL | 0 refills | Status: DC
Start: 2019-12-14 — End: 2020-03-06

## 2019-12-13 MED ORDER — HYDRALAZINE HCL 50 MG PO TABS
50.0000 mg | ORAL_TABLET | Freq: Three times a day (TID) | ORAL | 0 refills | Status: DC
Start: 2019-12-13 — End: 2020-02-03

## 2019-12-13 MED ORDER — THIAMINE HCL 100 MG PO TABS
100.0000 mg | ORAL_TABLET | Freq: Every day | ORAL | 0 refills | Status: DC
Start: 2019-12-14 — End: 2020-02-03

## 2019-12-13 MED ORDER — ADULT MULTIVITAMIN W/MINERALS CH
1.0000 | ORAL_TABLET | Freq: Every day | ORAL | 0 refills | Status: DC
Start: 2019-12-14 — End: 2020-02-03

## 2019-12-13 MED ORDER — HYDRALAZINE HCL 50 MG PO TABS
50.0000 mg | ORAL_TABLET | Freq: Three times a day (TID) | ORAL | Status: DC
  Administered 2019-12-13: 50 mg via ORAL
  Filled 2019-12-13: qty 1

## 2019-12-13 MED ORDER — ASPIRIN 81 MG PO CHEW
81.0000 mg | CHEWABLE_TABLET | Freq: Every day | ORAL | 0 refills | Status: DC
Start: 2019-12-14 — End: 2020-02-03

## 2019-12-13 MED ORDER — AMLODIPINE BESYLATE 10 MG PO TABS
10.0000 mg | ORAL_TABLET | Freq: Every day | ORAL | 0 refills | Status: DC
Start: 2019-12-14 — End: 2020-01-20

## 2019-12-13 NOTE — Progress Notes (Signed)
Pt's IV removed; site is clean, dry and intact. Discharge instructions reviewed with pt; voiced understanding. Personal belongings were packed by pt and transported with him. Transported pt via wheelchair to private vehicle driven by pt's dad.

## 2019-12-13 NOTE — Progress Notes (Signed)
   Cardiology asked to order 30-day event monitor to further evaluate etiology of his stroke. Order placed with Dr. Royann Shivers to read (DOD 12/13/19).  Beatriz Stallion, PA-C 12/13/19; 1:20 PM

## 2019-12-13 NOTE — Care Management (Signed)
Patient provided with [facility] letter. He verbalized understanding of how to use and appreciation.

## 2019-12-13 NOTE — Discharge Summary (Signed)
Physician Discharge Summary  Gerald Morgan ZOX:[id number] DOB: 1960-10-18 DOA: 12/08/2019  PCP: Patient, No Pcp Per  Admit date: 12/08/2019 Discharge date: 12/13/2019  Admitted From: Home Disposition: Home  Recommendations for Outpatient Follow-up:  1. Follow up with PCP in 1-2 weeks 2. Follow up with Neurology as an outpatient  3. Have 30 Day CardioNet Monitor at D/C 4. Please obtain CMP/CBC, Mag, Phos in one week 5. Please follow up on the following pending results:  Home Health: No Equipment/Devices: None  Discharge Condition: Stable CODE STATUS: FULL CODE  Diet recommendation: Heart Healthy Diet   Brief/Interim Summary: The patient is a 59 year old African-American male who is a regular drinker with a past medical history of hypertension who does not take any blood pressure medications, as well as a history of 2 craniotomies for intracranial hemorrhage in the past, who presented with acute onset of dizziness and difficult coordination.  He received a liter of fluid in the ED and began to develop worsening dyspnea with decreased oxygen saturation requiring BiPAP.  His subsequently placed on nitroglycerin drip and chest x-ray at that time showed vascular congestion and increased interstitial edema.  He then subsequently was given a dose of IV Lasix.  He subsequently was in flash pulmonary edema after diuresis improved.  Blood pressure was significantly still elevated so his nitroglycerin drip was stopped and he was to changed to a Cardene drip.  He subsequently started on p.o. medications and his blood pressure improved and he was transitioned out of the ICU after he is weaned off the Cardene drip.  His head CT scan was done initially and showed no evidence of any acute intracranial abnormalities but did show encephalomalacia in the left greater than the right frontal lobe as well as moderate bilateral white white matter hypodensity which was further evaluated with an MRI.  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  He was noted to have anterior inferior frontal encephalomalacia sequela of extra-axial hemorrhage as well.  Neurology was was consulted and will see the patient in the a.m.  His blood pressure still remains a little high and PT OT still pending to see the patient.  **Interim History Neurology was consulted and they did a further stroke work-up given his punctate infarcts.  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  His blood pressure was better controlled and we placed him on several antihypertensives.  He is stable to be discharged at this time as he is asymptomatic and will need to follow-up with PCP and establish with a PCP.  Case management was consulted for establishing PCP and will need to follow-up with neurology.  Cardiology was consulted for 30-day monitor and they will follow the patient in the outpatient setting.  Patient is currently stable to be discharged home and will need to avoid alcohol and smoking and make some lifestyle modifications.  Discharge Diagnoses:  Active Problems:   Hypertensive urgency   Cerebral thrombosis with cerebral infarction   Hypertensive emergency complicated with pulmonary edema -Subsequently went into flash pulmonary edema had to be placed on BiPAP -He was on a Cardene drip yesterday and pressures have been been improving however still remains significant elevated; blood pressure this AM was 159/105 -Continue amlodipine 10 mg p.o. daily; metoprolol switched to  succinate and was at 50 mg p.o. daily and will go to 100 mg  -We will add p.o. Hydralazine 25 mg po TID as well and increase to 50 mg po TID  -He has hydralazine 10 mg every 4 hours as  needed for systolic blood pressure being 604 or diastolic blood pressure greater than 100 -Continue to monitor blood pressures per protocol and goal blood pressure is to maintain a systolic less than 160 if he can readily achieves he can likely be discharged -Follow up with PCP for further BP medication adjustment and close monitoring   Acute respiratory failure with hypoxia in the setting of flash pulmonary edema from hypertensive emergency  -Placed on BiPAP initially and had been weaned off after diuresis -Patient feels much better and he was given IV Lasix 80 mg total while he was in the intensive care unit with increase of his serum creatinine from 1.58 -> 2.01; now his creatinine is improving and today is 1.73 -Further diuretics have been held  Renal Insufficiency but likely has some component of chronic kidney disease given his history of uncontrolled hypertension; Suspect CKD Stage 3a -Suspecting that his kidney disease is stage IIIa -Currently his BUN/creatinine had worsened after he was given diuretics and went from 13/1.58 -> 14/2.10 -> 14/1.99 -> 14/1.94 -> 15/1.73 -Avoid further nephrotoxic medications, contrast dyes, hypotension and Renally adjust medications -Repeat CMP in the AM   Hypophosphatemia -Patient's phosphorus was 2.2 and is now improved 4.9  -Replete with p.o. K-Phos Neutral 500 mg twice daily x2 doses the day before yesterday  -continue to monitor and treat as necessary -Repeat Phos Level in the AM  Acute punctate cerebral infarct in the MCA territory -In the setting of his hypertensive emergency -Head CT Showed "No CT evidence for acute intracranial abnormality. Encephalomalacia in the left greater than right frontal lobe. Moderate bilateral white matter hypodensity, possible age advanced chronic small vessel ischemic change though other white matter disease is not excluded. Further evaluation with MRI could be considered, which could be performed non emergently  unless clinical situation dictates otherwise." -MRI Brain showed "Few punctate acute left cerebral infarcts in the MCA territory. Advanced chronic microvascular ischemic changes. 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 Anterior inferior left frontal encephalomalacia. Sequelae of left extra-axial hemorrhage -MRA of the Brain done and showed "No intracranial large or medium vessel occlusion. No anterior circulation stenosis. 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 Vertebrals: Bilateral vertebral arteries demonstrate antegrade flow.  Subclavians: Normal flow hemodynamics were seen in bilateral subclavian arteries." -ECHOCardiogram showed "Left Ventricle: Left ventricular ejection fraction, by estimation, is 45  to 50%. The left ventricle has mildly decreased function. The left ventricle has no regional wall motion abnormalities. The left ventricular  internal cavity size was normal in size. There is mild left ventricular hypertrophy. Left ventricular diastolic parameters are consistent with Grade II diastolic dysfunction  (pseudonormalization)." There was no source of Embolus as well  -Neurology ordered LE Venous Duplex and showed no evidence of DVT -They also ordered a Hypercoaguable workup which is still pending  -PT/OT to evaluate and Treat and recommend no follow up -Neurology recommending aspirin 81 mg and Plavix 75 mg dual antiplatelet therapy for 3 weeks and then just aspirin alone -SLP cleared the patient for a Regular Diet  -Patient was placed on aspirin 81 mg and atorvastatin  and the ICU team stated they consulted neurology but neurology did not formally get this consult so we have reconsulted and Dr. Roda Shutters will see the patient in the morning -Continue to Monitor on Telemetry -Neurology recommends discharging once blood  pressure is better controlled and he will follow up with the stroke nurse practitioner at [facility] in 4 weeks  HLD -Patient's Lipid Panel showed total cholesterol/HDL ratio of 4.9, cholesterol level of 227, HDL of 46, LDL 159, triglycerides of 130, VLDL 22 -Started on atorvastatin 40 mg p.o. daily and will continue at discharge  Hypokalemia -The patient's potassium is improved and is improved and went from 3.0 -> 4.0 -> 3.5 -> 4.0 -Continue to monitor output as necessary  -Repeat CMP in a.m.  History of significant alcohol use  -Continue to monitor for signs and symptoms of withdrawal; currently no withdrawal symptoms noted -Continue with folic acid 1 mg p.o. daily, and thiamine -We will add multivitamin with minerals -Social work administered the CAGE questionnaire  Tobacco Abuse -Continues to smoke -Smoking cessation counseling given -We will give a nicotine patch at D/C  Discharge Instructions  Discharge Instructions    Ambulatory referral to Neurology   Complete by: As directed    Follow up with stroke clinic NP (Jessica Vanschaick or Darrol Angel, if both not available, consider Manson Allan, or Ahern) at [facility] in about 4 weeks. Thanks.   Call MD for:  difficulty breathing, headache or visual disturbances   Complete by: As directed    Call MD for:  extreme fatigue   Complete by: As directed    Call MD for:  hives   Complete by: As directed    Call MD for:  persistant dizziness or light-headedness   Complete by: As directed    Call MD for:  persistant nausea and vomiting   Complete by: As directed    Call MD for:  redness, tenderness, or signs of infection (pain, swelling, redness, odor or green/yellow discharge around incision site)   Complete by: As directed    Call MD for:  severe uncontrolled pain   Complete by: As directed    Call MD for:  temperature >100.4   Complete by: As directed    Diet - low sodium heart healthy   Complete by:  As directed    Discharge instructions   Complete by: As directed    You were cared for by a hospitalist during your hospital stay. If you have any questions about your discharge medications or the care you received while you were in the hospital after you are discharged, you can call the unit and ask to speak with the hospitalist on call if the hospitalist that took care of you is not available. Once you are discharged, your primary care physician will handle any further medical issues. Please note that NO REFILLS for any discharge medications will be authorized once you are discharged, as it is imperative that you return to your primary care physician (or establish a relationship with a primary care physician if you do not have one) for your aftercare needs so that they can reassess your need for medications and monitor your lab values.  Follow up with PCP and Neurology. Have 30 Day Monitor done in the outpatient setting Take all medications as prescribed. If symptoms change or worsen please return to the ED for evaluation   Increase activity slowly   Complete by: As directed      Allergies as of 12/13/2019  No Known Allergies     Medication List    STOP taking these medications   GOODY HEADACHE PO   HYDROcodone-acetaminophen 5-325 MG tablet Commonly known as: NORCO/VICODIN   lisinopril 10 MG tablet Commonly known as: ZESTRIL   promethazine 25 MG tablet Commonly known as: PHENERGAN     TAKE these medications   amLODipine 10 MG tablet Commonly known as: NORVASC Take 1 tablet (10 mg total) by mouth daily. Start taking on: December 14, 2019   aspirin 81 MG chewable tablet Chew 1 tablet (81 mg total) by mouth daily. Start taking on: December 14, 2019   atorvastatin 40 MG tablet Commonly known as: LIPITOR Take 1 tablet (40 mg total) by mouth daily. Start taking on: December 14, 2019   clopidogrel 75 MG tablet Commonly known as: PLAVIX Take 1 tablet (75 mg total) by mouth  daily. Start taking on: December 14, 2019   DSS 100 MG Caps Take 100 mg by mouth 2 (two) times daily.   folic acid 1 MG tablet Commonly known as: FOLVITE Take 1 tablet (1 mg total) by mouth daily. Start taking on: December 14, 2019   hydrALAZINE 50 MG tablet Commonly known as: APRESOLINE Take 1 tablet (50 mg total) by mouth every 8 (eight) hours.   metoprolol succinate 100 MG 24 hr tablet Commonly known as: TOPROL-XL Take 1 tablet (100 mg total) by mouth daily. Take with or immediately following a meal. Start taking on: December 14, 2019   multivitamin with minerals Tabs tablet Take 1 tablet by mouth daily. Start taking on: December 14, 2019   nicotine 7 mg/24hr patch Commonly known as: NICODERM CQ - dosed in mg/24 hr Place 1 patch (7 mg total) onto the skin daily. Start taking on: December 14, 2019   polyethylene glycol 17 g packet Commonly known as: MIRALAX / GLYCOLAX Take 17 g by mouth daily as needed for moderate constipation.   thiamine 100 MG tablet Take 1 tablet (100 mg total) by mouth daily. Start taking on: December 14, 2019       Follow-up Information    [facility] Follow up on 12/24/2019.   Why: Your appt is at 2:10 pm. Please arrive early and bring a photo ID and your current medications.  Contact information: [place] [address] [phone]       [facility] Follow up.   Why: Use this location for your pharmacy needs. They will assist with the cost of your medications. Contact information: [address] [place] [address] [phone]       [facility]. Schedule an appointment as soon as possible for a visit in 4 week(s).   Specialty: Neurology Contact information: [address] [place] [address] [phone]             No Known Allergies  Consultations:  PCCM  Transfer  Neurology  Procedures/Studies: CT Head Wo Contrast  Result Date: 12/08/2019 CLINICAL DATA:  Dizziness EXAM: CT HEAD WITHOUT CONTRAST TECHNIQUE: Contiguous axial images were obtained from the base of the skull through the vertex without intravenous contrast. COMPARISON:  CT 04/18/2014 FINDINGS: Brain: No acute territorial infarction, hemorrhage, or intracranial mass. Encephalomalacia in the left greater than right frontal lobes. Moderate hypodensity in the white matter. 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 The ventricles are nonenlarged. Vascular: No hyperdense vessels.  Carotid vascular calcification Skull:  Left craniotomy.  No acute fracture. Sinuses/Orbits: Mild mucosal thickening in the sinuses. Other: None IMPRESSION: 1. No CT evidence for acute intracranial abnormality. 2. Encephalomalacia in the left greater than right frontal lobe. 3. Moderate bilateral white matter hypodensity, possible age advanced chronic small vessel ischemic change though other white matter disease is not excluded. Further evaluation with MRI could be considered, which could be performed non emergently unless clinical situation dictates otherwise. Electronically Signed   By: Jasmine Pang M.D.   On: 12/08/2019 21:20   MR ANGIO HEAD WO CONTRAST  Result Date: 12/12/2019 CLINICAL DATA:  Dizziness. Alcohol abuse. Small acute infarctions in the left MCA territory shown by MRI 3 days ago. EXAM: MRA HEAD WITHOUT CONTRAST TECHNIQUE: Angiographic images of the Circle of Willis were obtained using MRA technique without intravenous contrast. COMPARISON:  MRI 12/09/2019. FINDINGS: Both internal carotid arteries are widely patent through the skull base and siphon regions. The anterior and middle cerebral vessels are patent without proximal stenosis, aneurysm or vascular malformation. Both vertebral arteries are patent to the basilar. No basilar stenosis. Severe stenosis of the left  posterior cerebral artery at the P1 P2 junction. IMPRESSION: 1. No intracranial large or medium vessel occlusion. No anterior circulation stenosis. 2. 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 Electronically Signed   By: Paulina Fusi M.D.   On: 12/12/2019 08:23   MR BRAIN WO CONTRAST  Result Date: 12/09/2019 CLINICAL DATA:  Dizziness, alcohol abuse EXAM: MRI HEAD WITHOUT CONTRAST TECHNIQUE: Multiplanar, multiecho pulse sequences of the brain and surrounding structures were obtained without intravenous contrast. COMPARISON:  None. FINDINGS: Brain: A few punctate foci of cortical/subcortical reduced diffusion are present in the left frontoparietal lobes and left parietotemporal junction. Anterior inferior left frontal encephalomalacia. Small central pontine infarcts. Confluent areas of T2 hyperintensity in the supratentorial and pontine white matter are nonspecific but may reflect advanced chronic microvascular ischemic changes. Involvement of the deep gray nuclei may reflect the same process or alternatively, together with pontine changes may reflect sequelae of osmotic demyelination. There is susceptibility along the left frontal convexity reflecting hemosiderin deposition from prior hemorrhage. Minimal additional scattered punctate foci of susceptibility likely reflect chronic microhemorrhages. There is no intracranial mass, mass effect, or edema. There is no hydrocephalus or extra-axial fluid collection. Vascular: Major vessel flow voids at the skull base are preserved. Skull and upper cervical spine: Evidence of prior wide left craniotomy. Normal marrow signal is preserved. Sinuses/Orbits: Paranasal sinus mucosal thickening primarily involving ethmoids and right sphenoid. Orbits are unremarkable. Other: Sella is unremarkable.  Mastoid air cells are clear. IMPRESSION: Few punctate acute left cerebral infarcts in the MCA territory. Advanced chronic microvascular ischemic  changes. 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 Anterior inferior left frontal encephalomalacia. Sequelae of left extra-axial hemorrhage. Electronically Signed   By: Guadlupe Spanish M.D.   On: 12/09/2019 17:38   DG Chest Port 1 View  Result Date: 12/10/2019 CLINICAL DATA:  Respiratory failure. EXAM: PORTABLE CHEST 1 VIEW COMPARISON:  December 08, 2019. FINDINGS: Stable cardiomediastinal silhouette. No pneumothorax or pleural effusion is noted. Both lungs are clear. The visualized skeletal structures are unremarkable. IMPRESSION: No active disease. Electronically Signed   By: Lupita Raider M.D.   On: 12/10/2019 08:07   DG Chest Port 1 View  Result Date: 12/08/2019 CLINICAL DATA:  Shortness of breath EXAM: PORTABLE CHEST 1 VIEW COMPARISON:  None. FINDINGS: Cardiomegaly with vascular congestion. Diffuse interstitial opacity suspicious for pulmonary edema.  No consolidation or effusion. No pneumothorax IMPRESSION: Cardiomegaly with vascular congestion and diffuse interstitial pulmonary edema Electronically Signed   By: Jasmine PangKim  Fujinaga M.D.   On: 12/08/2019 22:39   ECHOCARDIOGRAM COMPLETE  Result Date: 12/12/2019    ECHOCARDIOGRAM REPORT   Patient Name:   Barnett HatterVERIAN B Knoche Date of Exam: 12/12/2019 Medical Rec #:  [id number]       Height:       69.0 in Accession #:    [id number]      Weight:       159.8 lb Date of Birth:  10/18/1960       BSA:          1.878 m Patient Age:    58 years        BP:           163/107 mmHg Patient Gender: M               HR:           76 bpm. Exam Location:  Inpatient Procedure: 2D Echo, Cardiac Doppler and Color Doppler Indications:    Stroke 434.91/ I163.9  History:        Patient has no prior history of Echocardiogram examinations.                 Risk Factors:Hypertension and Current Smoker.  Sonographer:    Renella CunasJulia Swaim RDCS Referring Phys: [id number] JINDONG XU IMPRESSIONS  1. Left ventricular ejection fraction, by estimation, is 45 to 50%. The left  ventricle has mildly decreased function. The left ventricle has no regional wall motion abnormalities. There is mild left ventricular hypertrophy. Left ventricular diastolic parameters are consistent with Grade II diastolic dysfunction (pseudonormalization).  2. Right ventricular systolic function is normal. The right ventricular size is normal. There is normal pulmonary artery systolic pressure. The estimated right ventricular systolic pressure is 27.6 mmHg.  3. The mitral valve is normal in structure. Trivial mitral valve regurgitation. No evidence of mitral stenosis.  4. The aortic valve is normal in structure. Aortic valve regurgitation is trivial. No aortic stenosis is present.  5. The inferior vena cava is normal in size with greater than 50% respiratory variability, suggesting right atrial pressure of 3 mmHg. Conclusion(s)/Recommendation(s): No intracardiac source of embolism detected on this transthoracic study. A transesophageal echocardiogram is recommended to exclude cardiac source of embolism if clinically indicated. FINDINGS  Left Ventricle: Left ventricular ejection fraction, by estimation, is 45 to 50%. The left ventricle has mildly decreased function. The left ventricle has no regional wall motion abnormalities. The left ventricular internal cavity size was normal in size. There is mild left ventricular hypertrophy. Left ventricular diastolic parameters are consistent with Grade II diastolic dysfunction (pseudonormalization).  LV Wall Scoring: The anterior septum, basal inferior segment, and basal inferoseptal segment are akinetic. The mid inferior segment is hypokinetic. Right Ventricle: The right ventricular size is normal. No increase in right ventricular wall thickness. Right ventricular systolic function is normal. There is normal pulmonary artery systolic pressure. The tricuspid regurgitant velocity is 2.48 m/s, and  with an assumed right atrial pressure of 3 mmHg, the estimated right  ventricular systolic pressure is 27.6 mmHg. Left Atrium: Left atrial size was normal in size. Right Atrium: Right atrial size was normal in size. Pericardium: There is no evidence of pericardial effusion. Mitral Valve: The mitral valve is normal in structure. Normal mobility of the mitral valve leaflets. Trivial mitral valve regurgitation. No evidence of mitral valve stenosis. Tricuspid Valve: The  tricuspid valve is normal in structure. Tricuspid valve regurgitation is trivial. No evidence of tricuspid stenosis. Aortic Valve: The aortic valve is normal in structure. Aortic valve regurgitation is trivial. No aortic stenosis is present. Pulmonic Valve: The pulmonic valve was normal in structure. Pulmonic valve regurgitation is not visualized. No evidence of pulmonic stenosis. Aorta: The aortic root is normal in size and structure. Venous: The inferior vena cava is normal in size with greater than 50% respiratory variability, suggesting right atrial pressure of 3 mmHg. IAS/Shunts: No atrial level shunt detected by color flow Doppler.  LEFT VENTRICLE PLAX 2D LVIDd:         4.00 cm      Diastology LVIDs:         3.50 cm      LV e' lateral:   6.49 cm/s LV PW:         1.20 cm      LV E/e' lateral: 13.3 LV IVS:        1.20 cm      LV e' medial:    4.87 cm/s LVOT diam:     2.00 cm      LV E/e' medial:  17.7 LV SV:         53 LV SV Index:   28 LVOT Area:     3.14 cm  LV Volumes (MOD) LV vol d, MOD A2C: 125.0 ml LV vol d, MOD A4C: 133.0 ml LV vol s, MOD A2C: 67.1 ml LV vol s, MOD A4C: 64.3 ml LV SV MOD A2C:     57.9 ml LV SV MOD A4C:     133.0 ml LV SV MOD BP:      61.7 ml RIGHT VENTRICLE RV S prime:     9.49 cm/s TAPSE (M-mode): 1.8 cm LEFT ATRIUM             Index       RIGHT ATRIUM           Index LA diam:        3.90 cm 2.08 cm/m  RA Area:     14.70 cm LA Vol (A2C):   49.6 ml 26.41 ml/m RA Volume:   36.80 ml  19.59 ml/m LA Vol (A4C):   46.5 ml 24.76 ml/m LA Biplane Vol: 48.7 ml 25.93 ml/m  AORTIC VALVE LVOT Vmax:    106.00 cm/s LVOT Vmean:  67.600 cm/s LVOT VTI:    0.170 m  AORTA Ao Root diam: 3.00 cm MITRAL VALVE               TRICUSPID VALVE MV Area (PHT): 5.97 cm    TR Peak grad:   24.6 mmHg MV Decel Time: 127 msec    TR Vmax:        248.00 cm/s MV E velocity: 86.40 cm/s MV A velocity: 35.60 cm/s  SHUNTS MV E/A ratio:  2.43        Systemic VTI:  0.17 m                            Systemic Diam: 2.00 cm Donato Schultz MD Electronically signed by Donato Schultz MD Signature Date/Time: 12/12/2019/11:43:45 AM    Final    VAS US CAROTID  Result Date: 12/12/2019 Carotid Arterial Duplex Study Indications:       CVA. Risk Factors:      Hypertension. Comparison Study:  No prior studies. Performing Technologist: Jean Rosenthal  Examination Guidelines: A  complete evaluation includes B-mode imaging, spectral Doppler, color Doppler, and power Doppler as needed of all accessible portions of each vessel. Bilateral testing is considered an integral part of a complete examination. Limited examinations for reoccurring indications may be performed as noted.  Right Carotid Findings: +----------+--------+--------+--------+------------------+------------------+           PSV cm/sEDV cm/sStenosisPlaque DescriptionComments           +----------+--------+--------+--------+------------------+------------------+ CCA Prox  62      11                                intimal thickening +----------+--------+--------+--------+------------------+------------------+ CCA Distal65      16                                intimal thickening +----------+--------+--------+--------+------------------+------------------+ ICA Prox  67      29      1-39%   heterogenous                         +----------+--------+--------+--------+------------------+------------------+ ICA Distal68      25                                                   +----------+--------+--------+--------+------------------+------------------+ ECA       110     19                                                    +----------+--------+--------+--------+------------------+------------------+ +----------+--------+-------+----------------+-------------------+           PSV cm/sEDV cmsDescribe        Arm Pressure (mmHG) +----------+--------+-------+----------------+-------------------+ [id number]             Multiphasic, WNL                    +----------+--------+-------+----------------+-------------------+ +---------+--------+--+--------+--+---------+ VertebralPSV cm/s68EDV cm/s22Antegrade +---------+--------+--+--------+--+---------+  Left Carotid Findings: +----------+--------+--------+--------+------------------+------------------+           PSV cm/sEDV cm/sStenosisPlaque DescriptionComments           +----------+--------+--------+--------+------------------+------------------+ CCA Prox  124     27                                intimal thickening +----------+--------+--------+--------+------------------+------------------+ CCA Distal71      19                                intimal thickening +----------+--------+--------+--------+------------------+------------------+ ICA Prox  114     38      1-39%   heterogenous                         +----------+--------+--------+--------+------------------+------------------+ ICA Distal105     37                                                   +----------+--------+--------+--------+------------------+------------------+  ECA       75      14                                                   +----------+--------+--------+--------+------------------+------------------+ +----------+--------+--------+----------------+-------------------+           PSV cm/sEDV cm/sDescribe        Arm Pressure (mmHG) +----------+--------+--------+----------------+-------------------+ [id number]              Multiphasic, WNL                     +----------+--------+--------+----------------+-------------------+ +---------+--------+--+--------+--+---------+ VertebralPSV cm/s37EDV cm/s17Antegrade +---------+--------+--+--------+--+---------+   Summary: Right Carotid: Velocities in the right ICA are consistent with a 1-39% stenosis. Left Carotid: Velocities in the left ICA are consistent with a 1-39% stenosis. Vertebrals:  Bilateral vertebral arteries demonstrate antegrade flow. Subclavians: Normal flow hemodynamics were seen in bilateral subclavian              arteries. *See table(s) above for measurements and observations.     Preliminary    VAS [place] LOWER EXTREMITY VENOUS (DVT)  Result Date: 12/12/2019  Lower Venous DVTStudy Indications: Stroke.  Comparison Study: No prior studies. Performing Technologist: Jean Rosenthal  Examination Guidelines: A complete evaluation includes B-mode imaging, spectral Doppler, color Doppler, and power Doppler as needed of all accessible portions of each vessel. Bilateral testing is considered an integral part of a complete examination. Limited examinations for reoccurring indications may be performed as noted. The reflux portion of the exam is performed with the patient in reverse Trendelenburg.  +---------+---------------+---------+-----------+----------+--------------+ RIGHT    CompressibilityPhasicitySpontaneityPropertiesThrombus Aging +---------+---------------+---------+-----------+----------+--------------+ CFV      Full           Yes      Yes                                 +---------+---------------+---------+-----------+----------+--------------+ SFJ      Full           Yes      Yes                                 +---------+---------------+---------+-----------+----------+--------------+ FV Prox  Full                                                        +---------+---------------+---------+-----------+----------+--------------+ FV Mid   Full                                                         +---------+---------------+---------+-----------+----------+--------------+ FV DistalFull                                                        +---------+---------------+---------+-----------+----------+--------------+ PFV  Full                                                        +---------+---------------+---------+-----------+----------+--------------+ POP      Full           Yes      Yes                                 +---------+---------------+---------+-----------+----------+--------------+ PTV      Full                                                        +---------+---------------+---------+-----------+----------+--------------+ PERO     Full                                                        +---------+---------------+---------+-----------+----------+--------------+   +---------+---------------+---------+-----------+----------+--------------+ LEFT     CompressibilityPhasicitySpontaneityPropertiesThrombus Aging +---------+---------------+---------+-----------+----------+--------------+ CFV      Full           Yes      Yes                                 +---------+---------------+---------+-----------+----------+--------------+ SFJ      Full           Yes      Yes                                 +---------+---------------+---------+-----------+----------+--------------+ FV Prox  Full                                                        +---------+---------------+---------+-----------+----------+--------------+ FV Mid   Full                                                        +---------+---------------+---------+-----------+----------+--------------+ FV DistalFull                                                        +---------+---------------+---------+-----------+----------+--------------+ PFV      Full                                                         +---------+---------------+---------+-----------+----------+--------------+  POP      Full           Yes      Yes                                 +---------+---------------+---------+-----------+----------+--------------+ PTV      Full                                                        +---------+---------------+---------+-----------+----------+--------------+ PERO     Full                                                        +---------+---------------+---------+-----------+----------+--------------+     Summary: RIGHT: - There is no evidence of deep vein thrombosis in the lower extremity.  - No cystic structure found in the popliteal fossa.  LEFT: - There is no evidence of deep vein thrombosis in the lower extremity.  - No cystic structure found in the popliteal fossa.  *See table(s) above for measurements and observations. Electronically signed by Lemar Livings MD on 12/12/2019 at 11:20:08 AM.    Final     ECHOCARDIOGRAM IMPRESSIONS    1. Left ventricular ejection fraction, by estimation, is 45 to 50%. The  left ventricle has mildly decreased function. The left ventricle has no  regional wall motion abnormalities. There is mild left ventricular  hypertrophy. Left ventricular diastolic  parameters are consistent with Grade II diastolic dysfunction  (pseudonormalization).  2. Right ventricular systolic function is normal. The right ventricular  size is normal. There is normal pulmonary artery systolic pressure. The  estimated right ventricular systolic pressure is 27.6 mmHg.  3. The mitral valve is normal in structure. Trivial mitral valve  regurgitation. No evidence of mitral stenosis.  4. The aortic valve is normal in structure. Aortic valve regurgitation is  trivial. No aortic stenosis is present.  5. The inferior vena cava is normal in size with greater than 50%  respiratory variability, suggesting right atrial pressure of 3 mmHg.    Conclusion(s)/Recommendation(s): No intracardiac source of embolism  detected on this transthoracic study. A transesophageal echocardiogram is  recommended to exclude cardiac source of embolism if clinically indicated.   FINDINGS  Left Ventricle: Left ventricular ejection fraction, by estimation, is 45  to 50%. The left ventricle has mildly decreased function. The left  ventricle has no regional wall motion abnormalities. The left ventricular  internal cavity size was normal in  size. There is mild left ventricular hypertrophy. Left ventricular  diastolic parameters are consistent with Grade II diastolic dysfunction  (pseudonormalization).     LV Wall Scoring:  The anterior septum, basal inferior segment, and basal inferoseptal  segment  are akinetic. The mid inferior segment is hypokinetic.   Right Ventricle: The right ventricular size is normal. No increase in  right ventricular wall thickness. Right ventricular systolic function is  normal. There is normal pulmonary artery systolic pressure. The tricuspid  regurgitant velocity is 2.48 m/s, and  with an assumed right atrial pressure of 3 mmHg, the estimated right  ventricular systolic pressure is 27.6 mmHg.  Left Atrium: Left atrial size was normal in size.   Right Atrium: Right atrial size was normal in size.   Pericardium: There is no evidence of pericardial effusion.   Mitral Valve: The mitral valve is normal in structure. Normal mobility of  the mitral valve leaflets. Trivial mitral valve regurgitation. No evidence  of mitral valve stenosis.   Tricuspid Valve: The tricuspid valve is normal in structure. Tricuspid  valve regurgitation is trivial. No evidence of tricuspid stenosis.   Aortic Valve: The aortic valve is normal in structure. Aortic valve  regurgitation is trivial. No aortic stenosis is present.   Pulmonic Valve: The pulmonic valve was normal in structure. Pulmonic valve  regurgitation is not  visualized. No evidence of pulmonic stenosis.   Aorta: The aortic root is normal in size and structure.   Venous: The inferior vena cava is normal in size with greater than 50%  respiratory variability, suggesting right atrial pressure of 3 mmHg.   IAS/Shunts: No atrial level shunt detected by color flow Doppler.     LEFT VENTRICLE  PLAX 2D  LVIDd:     4.00 cm   Diastology  LVIDs:     3.50 cm   LV e' lateral:  6.49 cm/s  LV PW:     1.20 cm   LV E/e' lateral: 13.3  LV IVS:    1.20 cm   LV e' medial:  4.87 cm/s  LVOT diam:   2.00 cm   LV E/e' medial: 17.7  LV SV:     53  LV SV Index:  28  LVOT Area:   3.14 cm    LV Volumes (MOD)  LV vol d, MOD A2C: 125.0 ml  LV vol d, MOD A4C: 133.0 ml  LV vol s, MOD A2C: 67.1 ml  LV vol s, MOD A4C: 64.3 ml  LV SV MOD A2C:   57.9 ml  LV SV MOD A4C:   133.0 ml  LV SV MOD BP:   61.7 ml   RIGHT VENTRICLE  RV S prime:   9.49 cm/s  TAPSE (M-mode): 1.8 cm   LEFT ATRIUM       Index    RIGHT ATRIUM      Index  LA diam:    3.90 cm 2.08 cm/m RA Area:   14.70 cm  LA Vol (A2C):  49.6 ml 26.41 ml/m RA Volume:  36.80 ml 19.59 ml/m  LA Vol (A4C):  46.5 ml 24.76 ml/m  LA Biplane Vol: 48.7 ml 25.93 ml/m  AORTIC VALVE  LVOT Vmax:  106.00 cm/s  LVOT Vmean: 67.600 cm/s  LVOT VTI:  0.170 m    AORTA  Ao Root diam: 3.00 cm   MITRAL VALVE        TRICUSPID VALVE  MV Area (PHT): 5.97 cm  TR Peak grad:  24.6 mmHg  MV Decel Time: 127 msec  TR Vmax:    248.00 cm/s  MV E velocity: 86.40 cm/s  MV A velocity: 35.60 cm/s SHUNTS  MV E/A ratio: 2.43    Systemic VTI: 0.17 m               Systemic Diam: 2.00 cm   Subjective: Seen and examined at bedside and the patient felt well and had no complaints. Denied any CP or SOB. No lightheadedness or dizziness. No other concerns or complaints at this time.  Discharge Exam: Vitals:    12/13/19 0809 12/13/19 1208  BP: (!) 159/105 (!) 172/108  Pulse: 84 68  Resp: 18 20  Temp: 97.7 F (36.5 C) 97.7 F (36.5 C)  SpO2: 100% 100%   Vitals:   12/13/19 0001 12/13/19 0421 12/13/19 0809 12/13/19 1208  BP: (!) 181/103 (!) 159/110 (!) 159/105 (!) 172/108  Pulse: 83 85 84 68  Resp: 18 19 18 20   Temp: 99.2 F (37.3 C) 98.2 F (36.8 C) 97.7 F (36.5 C) 97.7 F (36.5 C)  TempSrc:  Oral Oral Oral  SpO2: 100% 99% 100% 100%  Weight:      Height:       General: Pt is alert, awake, not in acute distress Cardiovascular: RRR, S1/S2 +, no rubs, no gallops Respiratory: Diminished bilaterally, no wheezing, no rhonchi Abdominal: Soft, NT, ND, bowel sounds + Extremities: no edema, no cyanosis  The results of significant diagnostics from this hospitalization (including imaging, microbiology, ancillary and laboratory) are listed below for reference.    Microbiology: Recent Results (from the past 240 hour(s))  SARS Coronavirus 2 by RT PCR (hospital order, performed in [facility] hospital lab) Nasopharyngeal Nasopharyngeal Swab     Status: None   Collection Time: 12/09/19 12:42 AM   Specimen: Nasopharyngeal Swab  Result Value Ref Range Status   SARS Coronavirus 2 NEGATIVE NEGATIVE Final    Comment: (NOTE) SARS-CoV-2 target nucleic acids are NOT DETECTED.  The SARS-CoV-2 RNA is generally detectable in upper and lower respiratory specimens during the acute phase of infection. The lowest concentration of SARS-CoV-2 viral copies this assay can detect is 250 copies / mL. A negative result does not preclude SARS-CoV-2 infection and should not be used as the sole basis for treatment or other patient management decisions.  A negative result may occur with improper specimen collection / handling, submission of specimen other than nasopharyngeal swab, presence of viral mutation(s) within the areas targeted by this assay, and inadequate number of viral copies (<250 copies / mL). A  negative result must be combined with clinical observations, patient history, and epidemiological information.  Fact Sheet for Patients:   12/11/19  Fact Sheet for Healthcare Providers: BoilerBrush.com.cy  This test is not yet approved or  cleared by the https://pope.com/ FDA and has been authorized for detection and/or diagnosis of SARS-CoV-2 by FDA under an Emergency Use Authorization (EUA).  This EUA will remain in effect (meaning this test can be used) for the duration of the COVID-19 declaration under Section 564(b)(1) of the Act, 21 U.S.C. section 360bbb-3(b)(1), unless the authorization is terminated or revoked sooner.  Performed at [facility], [address], [place], [place]   MRSA PCR Screening     Status: None   Collection Time: 12/09/19 12:06 PM   Specimen: Nasopharyngeal  Result Value Ref Range Status   MRSA by PCR NEGATIVE NEGATIVE Final    Comment:        The GeneXpert MRSA Assay (FDA approved for NASAL specimens only), is one component of a comprehensive MRSA colonization surveillance program. It is not intended to diagnose MRSA infection nor to guide or monitor treatment for MRSA infections. Performed at [facility], [address], [place], [place]     Labs: BNP (last 3 results) No results for input(s): BNP in the last 8760 hours. Basic Metabolic Panel: Recent Labs  Lab 12/09/19 0052 12/10/19 0032 12/11/19 0918 12/12/19 0413 12/13/19 0149  NA 138 139 139 140 138  K 3.1* 3.0* 4.0 3.5 4.0  CL 105 103 106 106 105  CO2 24 26 24 22  22  GLUCOSE 162* 114* 102* 105* 104*  BUN CREATININE 1.58* 2.10* 1.99* 1.94* 1.73*  CALCIUM 8.3* 8.5* 9.1 9.2 9.1  MG 1.8 1.8 1.9 1.7 2.0  PHOS 4.1 2.5 2.2* 3.7 4.9*   Liver Function Tests: Recent Labs  Lab 12/11/19 0918 12/12/19 0413 12/13/19 0149  AST ALT ALKPHOS 74 68 78  BILITOT 0.6  0.8 0.5  PROT 7.2 6.9 6.9  ALBUMIN 3.6 3.4* 3.3*   No results for input(s): LIPASE, AMYLASE in the last 168 hours. No results for input(s): AMMONIA in the last 168 hours. CBC: Recent Labs  Lab 12/09/19 0052 12/10/19 0032 12/11/19 0918 12/12/19 0413 12/13/19 0149  WBC 9.3 9.1 6.3 6.6 6.4  NEUTROABS  --  5.3 3.7 4.3 3.7  HGB 14.7 13.9 14.9 14.3 14.3  HCT 46.4 41.7 45.1 43.9 43.5  MCV 95.3 92.9 93.4 93.2 94.0  PLT 240 254 307 300 315   Cardiac Enzymes: No results for input(s): CKTOTAL, CKMB, CKMBINDEX, TROPONINI in the last 168 hours. BNP: Invalid input(s): POCBNP CBG: Recent Labs  Lab 12/08/19 1232 12/09/19 1200  GLUCAP 106* 161*   D-Dimer No results for input(s): DDIMER in the last 72 hours. Hgb A1c Recent Labs    12/12/19 0413  HGBA1C 5.5   Lipid Profile Recent Labs    12/12/19 0413  CHOL 227*  HDL 46  LDLCALC 159*  TRIG 108  CHOLHDL 4.9   Thyroid function studies No results for input(s): TSH, T4TOTAL, T3FREE, THYROIDAB in the last 72 hours.  Invalid input(s): FREET3 Anemia work up No results for input(s): VITAMINB12, FOLATE, FERRITIN, TIBC, IRON, RETICCTPCT in the last 72 hours. Urinalysis    Component Value Date/Time   COLORURINE YELLOW 12/08/2019 2108   APPEARANCEUR CLEAR 12/08/2019 2108   LABSPEC 1.016 12/08/2019 2108   PHURINE 5.0 12/08/2019 2108   GLUCOSEU 50 (A) 12/08/2019 2108   HGBUR SMALL (A) 12/08/2019 2108   BILIRUBINUR NEGATIVE 12/08/2019 2108   KETONESUR NEGATIVE 12/08/2019 2108   PROTEINUR 100 (A) 12/08/2019 2108   UROBILINOGEN 1.0 12/20/2011 0041   NITRITE NEGATIVE 12/08/2019 2108   LEUKOCYTESUR NEGATIVE 12/08/2019 2108   Sepsis Labs Invalid input(s): PROCALCITONIN,  WBC,  LACTICIDVEN Microbiology Recent Results (from the past 240 hour(s))  SARS Coronavirus 2 by RT PCR (hospital order, performed in [facility] hospital lab) Nasopharyngeal Nasopharyngeal Swab     Status: None   Collection Time: 12/09/19 12:42 AM   Specimen:  Nasopharyngeal Swab  Result Value Ref Range Status   SARS Coronavirus 2 NEGATIVE NEGATIVE Final    Comment: (NOTE) SARS-CoV-2 target nucleic acids are NOT DETECTED.  The SARS-CoV-2 RNA is generally detectable in upper and lower respiratory specimens during the acute phase of infection. The lowest concentration of SARS-CoV-2 viral copies this assay can detect is 250 copies / mL. A negative result does not preclude SARS-CoV-2 infection and should not be used as the sole basis for treatment or other patient management decisions.  A negative result may occur with improper specimen collection / handling, submission of specimen other than nasopharyngeal swab, presence of viral mutation(s) within the areas targeted by this assay, and inadequate number of viral copies (<250 copies / mL). A negative result must be combined with clinical observations, patient history, and epidemiological information.  Fact Sheet for Patients:   BoilerBrush.com.cy  Fact Sheet for Healthcare Providers: https://pope.com/  This test is not yet approved or  cleared by the [place]  FDA and has been authorized for detection and/or diagnosis of SARS-CoV-2 by FDA under an Emergency Use Authorization (EUA).  This EUA will remain in effect (meaning this test can be used) for the duration of the COVID-19 declaration under Section 564(b)(1) of the Act, 21 U.S.C. section 360bbb-3(b)(1), unless the authorization is terminated or revoked sooner.  Performed at [facility], [address], [place], [place] [address]   MRSA PCR Screening     Status: None   Collection Time: 12/09/19 12:06 PM   Specimen: Nasopharyngeal  Result Value Ref Range Status   MRSA by PCR NEGATIVE NEGATIVE Final    Comment:        The GeneXpert MRSA Assay (FDA approved for NASAL specimens only), is one component of a comprehensive MRSA colonization surveillance program. It is  not intended to diagnose MRSA infection nor to guide or monitor treatment for MRSA infections. Performed at [facility], [address] [place], [place] [address]    Time coordinating discharge: 35 minutes  SIGNED:  Merlene Laughter, DO [facility] 12/13/2019, 12:57 PM Pager is on AMION  If 7PM-7AM, please contact night-coverage [email or web address]

## 2019-12-14 LAB — CARDIOLIPIN ANTIBODIES, IGG, IGM, IGA
Anticardiolipin IgA: <9 APL U/mL (ref 0–11)
Anticardiolipin IgG: <9 GPL U/mL (ref 0–14)
Anticardiolipin IgM: 22 MPL U/mL — ABNORMAL HIGH (ref 0–12)

## 2019-12-15 LAB — FANA STAINING PATTERNS: Homogeneous Pattern: 1 — ABNORMAL HIGH

## 2019-12-15 LAB — ANTINUCLEAR ANTIBODIES, IFA: ANA Ab, IFA: POSITIVE — AB

## 2019-12-18 ENCOUNTER — Telehealth: Payer: Self-pay | Admitting: *Deleted

## 2019-12-18 ENCOUNTER — Encounter: Payer: Self-pay | Admitting: *Deleted

## 2019-12-18 NOTE — Telephone Encounter (Signed)
Attempted to contact patient to notify he has been enrolled with [employer] to ship a cardiac event monitor and to review self pay options. Line busy.  Several attempts made.

## 2019-12-18 NOTE — Progress Notes (Signed)
Patient ID: Gerald Morgan, male   DOB: October 28, 1960, 59 y.o.   MRN: [id number] Patient enrolled for [employer] to ship a 30 day cardiac event monitor to his home. Unable to reach patient to explain self pay options.  Instructions and billing information included in kit.

## 2019-12-24 ENCOUNTER — Telehealth: Payer: Self-pay | Admitting: *Deleted

## 2019-12-24 ENCOUNTER — Inpatient Hospital Stay (INDEPENDENT_AMBULATORY_CARE_PROVIDER_SITE_OTHER): Payer: Self-pay | Admitting: Primary Care

## 2019-12-24 NOTE — Telephone Encounter (Signed)
Attempted to reach the patient to confirm his address. [employer] has been unable to reach him to mail the monitor.

## 2019-12-30 NOTE — Telephone Encounter (Signed)
Attempted to contact patient to confirm his address for his cardiac monitor and the number listed is busy. Called the number listed for patient's brother Sony Schlarb and left a voice message asking that he have his brother Gerald Morgan contact our office.

## 2020-01-20 ENCOUNTER — Other Ambulatory Visit (INDEPENDENT_AMBULATORY_CARE_PROVIDER_SITE_OTHER): Payer: Self-pay | Admitting: Primary Care

## 2020-01-20 ENCOUNTER — Other Ambulatory Visit: Payer: Self-pay

## 2020-01-20 ENCOUNTER — Ambulatory Visit (INDEPENDENT_AMBULATORY_CARE_PROVIDER_SITE_OTHER): Payer: Self-pay | Admitting: Primary Care

## 2020-01-20 ENCOUNTER — Encounter (INDEPENDENT_AMBULATORY_CARE_PROVIDER_SITE_OTHER): Payer: Self-pay | Admitting: Primary Care

## 2020-01-20 VITALS — BP 215/137 | HR 77 | Temp 97.7°F | Ht 69.0 in | Wt 142.0 lb

## 2020-01-20 DIAGNOSIS — I160 Hypertensive urgency: Secondary | ICD-10-CM

## 2020-01-20 DIAGNOSIS — Z9119 Patient's noncompliance with other medical treatment and regimen: Secondary | ICD-10-CM

## 2020-01-20 DIAGNOSIS — Z9114 Patient's other noncompliance with medication regimen: Secondary | ICD-10-CM

## 2020-01-20 DIAGNOSIS — Z760 Encounter for issue of repeat prescription: Secondary | ICD-10-CM

## 2020-01-20 DIAGNOSIS — Z91199 Patient's noncompliance with other medical treatment and regimen due to unspecified reason: Secondary | ICD-10-CM

## 2020-01-20 MED ORDER — AMLODIPINE BESYLATE 10 MG PO TABS: 10.0000 mg | ORAL_TABLET | Freq: Every day | ORAL | 1 refills | Status: DC

## 2020-01-20 MED ORDER — LOSARTAN POTASSIUM 100 MG PO TABS: 100.0000 mg | ORAL_TABLET | Freq: Every day | ORAL | 3 refills | Status: DC

## 2020-01-20 MED ORDER — AMLODIPINE BESYLATE 10 MG PO TABS
10.0000 mg | ORAL_TABLET | Freq: Every day | ORAL | 1 refills | Status: DC
Start: 2020-01-20 — End: 2020-01-20

## 2020-01-20 MED ORDER — METOPROLOL SUCCINATE ER 100 MG PO TB24
100.0000 mg | ORAL_TABLET | Freq: Every day | ORAL | 1 refills | Status: DC
Start: 2020-01-20 — End: 2020-01-20

## 2020-01-20 MED ORDER — CLONIDINE HCL 0.1 MG PO TABS
0.2000 mg | ORAL_TABLET | Freq: Once | ORAL | Status: AC
  Administered 2020-01-20: 0.2 mg via ORAL

## 2020-01-20 MED ORDER — METOPROLOL SUCCINATE ER 100 MG PO TB24: 100.0000 mg | ORAL_TABLET | Freq: Every day | ORAL | 1 refills | Status: DC

## 2020-01-20 MED FILL — AMLODIPINE BESYLATE 10 MG T: 10 | 30 days supply | Qty: 30 | Fill #0

## 2020-01-20 MED FILL — METOPROLOL SUCCINATE ER 100: 100 | 30 days supply | Qty: 30 | Fill #0

## 2020-01-20 MED FILL — LOSARTAN POTASSIUM 100 MG T: 100 | 30 days supply | Qty: 30 | Fill #0

## 2020-01-20 NOTE — Progress Notes (Addendum)
Assessment and Plan: Gerald Morgan (59) was seen today for hospitalization follow-up and medication refill.  Diagnoses and all orders for this visit:  Hypertensive urgency Counseled on blood pressure goal of less than 130/80, low-sodium, DASH diet, medication compliance, 150 minutes of moderate intensity exercise per week. Discussed medication compliance, adverse effects. -     cloNIDine (CATAPRES) tablet 0.2 mg -     amLODipine (NORVASC) 10 MG tablet; Take 1 tablet (10 mg total) by mouth daily. -     losartan (COZAAR) 100 MG tablet; Take 1 tablet (100 mg total) by mouth daily. -     metoprolol succinate (TOPROL-XL) 100 MG 24 hr tablet; Take 1 tablet (100 mg total) by mouth daily. Take with or immediately following a meal.  Lack of follow-up after hospitalization Discharge from hospital on 12/08/2019 today in for hospital follow up order are below  -     CBC with Differential -     CMP14+EGFR -     Magnesium -     Phosphorus  Non compliance w medication regimen No medication since discharge   Other orders/Medication refill -     Discontinue: amLODipine (NORVASC) 10 MG tablet; Take 1 tablet (10 mg total) by mouth daily. -     Discontinue: metoprolol succinate (TOPROL-XL) 100 MG 24 hr tablet; Take 1 tablet (100 mg total) by mouth daily. Take with or immediately following a meal. -     Discontinue: losartan (COZAAR) 100 MG tablet; Take 1 tablet (100 mg total) by mouth daily. -     clopidogrel (PLAVIX) 75 MG tablet; Take 1 tablet (75 mg total) by mouth daily.  HPI  [name] 59 y.o.male presents for follow up from the hospital. Admit date to the hospital was 12/08/19, patient was discharged from the hospital on 12/13/19, patient was admitted for: Hypertensive urgency and Cerebral thrombosis with cerebral infarction.     Past Medical History:  Diagnosis Date  . Hypertension      No Known Allergies    Current Outpatient Medications on File Prior to Visit  Medication Sig Dispense Refill   . amLODipine (NORVASC) 10 MG tablet Take 1 tablet (10 mg total) by mouth daily. (Patient not taking: Reported on 01/20/2020) 30 tablet 0  . aspirin 81 MG chewable tablet Chew 1 tablet (81 mg total) by mouth daily. (Patient not taking: Reported on 01/20/2020) 30 tablet 0  . atorvastatin (LIPITOR) 40 MG tablet Take 1 tablet (40 mg total) by mouth daily. (Patient not taking: Reported on 01/20/2020) 30 tablet 0  . clopidogrel (PLAVIX) 75 MG tablet Take 1 tablet (75 mg total) by mouth daily. (Patient not taking: Reported on 01/20/2020) 57 tablet 0  . folic acid (FOLVITE) 1 MG tablet Take 1 tablet (1 mg total) by mouth daily. (Patient not taking: Reported on 01/20/2020) 30 tablet 0  . hydrALAZINE (APRESOLINE) 50 MG tablet Take 1 tablet (50 mg total) by mouth every 8 (eight) hours. (Patient not taking: Reported on 01/20/2020) 90 tablet 0  . metoprolol succinate (TOPROL-XL) 100 MG 24 hr tablet Take 1 tablet (100 mg total) by mouth daily. Take with or immediately following a meal. (Patient not taking: Reported on 01/20/2020) 30 tablet 0  . Multiple Vitamin (MULTIVITAMIN WITH MINERALS) TABS tablet Take 1 tablet by mouth daily. (Patient not taking: Reported on 01/20/2020) 30 tablet 0  . polyethylene glycol (MIRALAX / GLYCOLAX) 17 g packet Take 17 g by mouth daily as needed for moderate constipation. (Patient not taking: Reported on 01/20/2020) 14  each 0  . thiamine 100 MG tablet Take 1 tablet (100 mg total) by mouth daily. (Patient not taking: Reported on 01/20/2020) 30 tablet 0   No current facility-administered medications on file prior to visit.    ROS: all negative except above.   Physical Exam: Filed Weights   01/20/20 1424  Weight: 142 lb (64.4 kg)   BP (!) 230/152 (BP Location: Right Arm, Patient Position: Sitting, Cuff Size: Normal)   Pulse 76   Temp 97.7 F (36.5 C) (Temporal)   Ht 5' 9" (1.753 m)   Wt 142 lb (64.4 kg)   SpO2 98%   BMI 20.97 kg/m  General Appearance: Well nourished, in no  apparent distress. Eyes: PERRLA, EOMs, conjunctiva no swelling or erythema Sinuses: No Frontal/maxillary tenderness ENT/Mouth: Ext aud canals clear, TMs without erythema, bulging. No erythema, swelling, or exudate on post pharynx.  Tonsils not swollen or erythematous. Hearing normal.  Neck: Supple, thyroid normal.  Respiratory: Respiratory effort normal, BS equal bilaterally without rales, rhonchi, wheezing or stridor.  Cardio: RRR with no MRGs. Brisk peripheral pulses without edema.  Abdomen: Soft, + BS.  Non tender, no guarding, rebound, hernias, masses. Lymphatics: Non tender without lymphadenopathy.  Musculoskeletal: Full ROM, 5/5 strength, normal gait.  Skin: Warm, dry without rashes, lesions, ecchymosis.  Neuro: Cranial nerves intact. Normal muscle tone, no cerebellar symptoms. Sensation intact.  Psych: Awake and oriented X 3, normal affect, Insight and Judgment appropriate.     Kerin Perna, NP 2:40 PM

## 2020-01-21 LAB — CBC WITH DIFFERENTIAL/PLATELET
Basophils Absolute: 0 10*3/uL (ref 0.0–0.2)
Basos: 1 %
EOS (ABSOLUTE): 0.3 10*3/uL (ref 0.0–0.4)
Eos: 5 %
Hematocrit: 41.1 % (ref 37.5–51.0)
Hemoglobin: 14.1 g/dL (ref 13.0–17.7)
Immature Grans (Abs): 0 10*3/uL (ref 0.0–0.1)
Immature Granulocytes: 0 %
Lymphocytes Absolute: 1.6 10*3/uL (ref 0.7–3.1)
Lymphs: 28 %
MCH: 30.8 pg (ref 26.6–33.0)
MCHC: 34.3 g/dL (ref 31.5–35.7)
MCV: 90 fL (ref 79–97)
Monocytes Absolute: 0.7 10*3/uL (ref 0.1–0.9)
Monocytes: 12 %
Neutrophils Absolute: 3.1 10*3/uL (ref 1.4–7.0)
Neutrophils: 54 %
Platelets: 348 10*3/uL (ref 150–450)
RBC: 4.58 x10E6/uL (ref 4.14–5.80)
RDW: 12.7 % (ref 11.6–15.4)
WBC: 5.6 10*3/uL (ref 3.4–10.8)

## 2020-01-21 LAB — PHOSPHORUS: Phosphorus: 3.7 mg/dL (ref 2.8–4.1)

## 2020-01-21 LAB — CMP14+EGFR
ALT: 11 IU/L (ref 0–44)
AST: 18 IU/L (ref 0–40)
Albumin/Globulin Ratio: 1.4 (ref 1.2–2.2)
Albumin: 4.2 g/dL (ref 3.8–4.9)
Alkaline Phosphatase: 106 IU/L (ref 44–121)
BUN/Creatinine Ratio: 11 (ref 9–20)
BUN: 14 mg/dL (ref 6–24)
Bilirubin Total: <0.2 mg/dL (ref 0.0–1.2)
CO2: 25 mmol/L (ref 20–29)
Calcium: 9.5 mg/dL (ref 8.7–10.2)
Chloride: 103 mmol/L (ref 96–106)
Creatinine, Ser: 1.22 mg/dL (ref 0.76–1.27)
GFR calc Af Amer: 76 mL/min/{1.73_m2} (ref >59)
GFR calc non Af Amer: 65 mL/min/{1.73_m2} (ref >59)
Globulin, Total: 3.1 g/dL (ref 1.5–4.5)
Glucose: 104 mg/dL — ABNORMAL HIGH (ref 65–99)
Potassium: 4.4 mmol/L (ref 3.5–5.2)
Sodium: 141 mmol/L (ref 134–144)
Total Protein: 7.3 g/dL (ref 6.0–8.5)

## 2020-01-21 LAB — MAGNESIUM: Magnesium: 2 mg/dL (ref 1.6–2.3)

## 2020-01-21 MED ORDER — CLOPIDOGREL BISULFATE 75 MG PO TABS
75.0000 mg | ORAL_TABLET | Freq: Every day | ORAL | 0 refills | Status: DC
Start: 2020-01-21 — End: 2020-03-06

## 2020-01-22 MED FILL — CLOPIDOGREL 75 MG TABLET: 75 | 30 days supply | Qty: 30 | Fill #0

## 2020-01-26 ENCOUNTER — Encounter (INDEPENDENT_AMBULATORY_CARE_PROVIDER_SITE_OTHER): Payer: Self-pay

## 2020-02-03 ENCOUNTER — Other Ambulatory Visit: Payer: Self-pay

## 2020-02-03 ENCOUNTER — Encounter (INDEPENDENT_AMBULATORY_CARE_PROVIDER_SITE_OTHER): Payer: Self-pay | Admitting: Primary Care

## 2020-02-03 ENCOUNTER — Ambulatory Visit (INDEPENDENT_AMBULATORY_CARE_PROVIDER_SITE_OTHER): Payer: Self-pay | Admitting: Primary Care

## 2020-02-03 VITALS — BP 191/127 | HR 89 | Temp 97.5°F | Ht 69.0 in | Wt 143.4 lb

## 2020-02-03 DIAGNOSIS — I160 Hypertensive urgency: Secondary | ICD-10-CM

## 2020-02-03 DIAGNOSIS — F17200 Nicotine dependence, unspecified, uncomplicated: Secondary | ICD-10-CM

## 2020-02-03 MED ORDER — LABETALOL HCL 300 MG PO TABS: 300.0000 mg | ORAL_TABLET | Freq: Two times a day (BID) | ORAL | 3 refills | Status: DC

## 2020-02-03 MED ORDER — BUPROPION HCL ER (SR) 150 MG PO TB12: 150.0000 mg | ORAL_TABLET | Freq: Two times a day (BID) | ORAL | 3 refills | Status: DC

## 2020-02-03 NOTE — Progress Notes (Signed)
[facility]   Gerald Morgan is a 59 year old male that continues to smoke and  alcohol but endorses taking his blood pressure medication daily Blood pressure remains uncontrolled Presents for  hypertension evaluation, on previous visit patient reported not taking medications.  Refill all previous medications in today to reevaluate blood pressure Current Medication List Current Outpatient Medications on File Prior to Visit  Medication Sig Dispense Refill   amLODipine (NORVASC) 10 MG tablet Take 1 tablet (10 mg total) by mouth daily. 90 tablet 1   clopidogrel (PLAVIX) 75 MG tablet Take 1 tablet (75 mg total) by mouth daily. 30 tablet 0   losartan (COZAAR) 100 MG tablet Take 1 tablet (100 mg total) by mouth daily. 90 tablet 3   atorvastatin (LIPITOR) 40 MG tablet Take 1 tablet (40 mg total) by mouth daily. (Patient not taking: Reported on 01/20/2020) 30 tablet 0   No current facility-administered medications on file prior to visit.  Past Medical History  Past Medical History:  Diagnosis Date   Hypertension    Dietary habits include: unhealthy discussed hot dogs and spam  Exercise habits include:walking but not on a consistence bases  Family / Social history: MI-brother  ASCVD risk factors include- [place]  O:  Physical Exam Vitals reviewed.  HENT:     Head: Normocephalic.     Nose: Nose normal.  Cardiovascular:     Rate and Rhythm: Normal rate and regular rhythm.  Pulmonary:     Breath sounds: Wheezing and rhonchi present.  Abdominal:     General: Bowel sounds are normal.     Palpations: Abdomen is soft.  Musculoskeletal:        General: Normal range of motion.     Cervical back: Normal range of motion and neck supple.  Skin:    General: Skin is warm and dry.  Neurological:     Mental Status: He is alert and oriented to person, place, and time.  Psychiatric:        Mood and Affect: Mood normal.        Behavior: Behavior normal.        Thought  Content: Thought content normal.        Judgment: Judgment normal.      Review of Systems  All other systems reviewed and are negative.   Last 3 Office BP readings: BP Readings from Last 3 Encounters:  02/03/20 (!) 191/127  01/20/20 (!) 215/137  12/13/19 (!) 172/108    BMET    Component Value Date/Time   NA 141 01/20/2020 1557   K 4.4 01/20/2020 1557   CL 103 01/20/2020 1557   CO2 25 01/20/2020 1557   GLUCOSE 104 (H) 01/20/2020 1557   GLUCOSE 104 (H) 12/13/2019 0149   BUN 14 01/20/2020 1557   CREATININE 1.22 01/20/2020 1557   CALCIUM 9.5 01/20/2020 1557   GFRNONAA 65 01/20/2020 1557   GFRAA 76 01/20/2020 1557    Renal function: Estimated Creatinine Clearance: 61.4 mL/min (by C-G formula based on SCr of 1.22 mg/dL).  Clinical ASCVD: No  The ASCVD Risk score Gerald George DC Jr., et al., 2013) failed to calculate for the following reasons:   The patient has a prior MI or stroke diagnosis Hypertension is longstanding diagnosed currently on amlodipine 10 mg daily metoprolol 100 mg XR daily and Cozaar 100 mg daily current medications. BP Goal =130/80  mmHg. Patient is adherent with current medication Consulted with cardiologist regimen change discontinued metoprolol 100 XL changed to labetalol 300 mg  twice daily continue amlodipine 10 mg daily, losartan 100 daily and if no improvement on follow-up and hydralazine 50 mg 3 times a day.  -F/u labs ordered none -Counseled on lifestyle modifications for blood pressure control including reduced dietary sodium, increased exercise, adequate sleep  Tobacco dependence Discussed increased risk of having another stroke and outcome may not be as favorable as this one.  Continues to smoke cigarettes which is also the underlying cause of elevated blood pressure.  Patient has agreed to trying Wellbutrin for smoking cessation   Gerald Morgan Consultant report cardiologist for hypertension urgency

## 2020-02-03 NOTE — Patient Instructions (Addendum)
Stop taking Metoprolol changed to Labetalol 300mg  twice daily continue amlodipine 10mg  daily and Losartan 100mg  daily   Bupropion extended-release tablets (Depression/Mood Disorders) What is this medicine? BUPROPION (byoo PROE pee on) is used to treat depression. This medicine may be used for other purposes; ask your health care provider or pharmacist if you have questions. COMMON BRAND NAME(S): Aplenzin, Budeprion XL, Forfivo XL, Wellbutrin XL What should I tell my health care provider before I take this medicine? They need to know if you have any of these conditions:  an eating disorder, such as anorexia or bulimia  bipolar disorder or psychosis  diabetes or high blood sugar, treated with medication  glaucoma  head injury or brain tumor  heart disease, previous heart attack, or irregular heart beat  high blood pressure  kidney or liver disease  seizures (convulsions)  suicidal thoughts or a previous suicide attempt  Tourette's syndrome  weight loss  an unusual or allergic reaction to bupropion, other medicines, foods, dyes, or preservatives  breast-feeding  pregnant or trying to become pregnant How should I use this medicine? Take this medicine by mouth with a glass of water. Follow the directions on the prescription label. You can take it with or without food. If it upsets your stomach, take it with food. Do not crush, chew, or cut these tablets. This medicine is taken once daily at the same time each day. Do not take your medicine more often than directed. Do not stop taking this medicine suddenly except upon the advice of your doctor. Stopping this medicine too quickly may cause serious side effects or your condition may worsen. A special MedGuide will be given to you by the pharmacist with each prescription and refill. Be sure to read this information carefully each time. Talk to your pediatrician regarding the use of this medicine in children. Special care may be  needed. Overdosage: If you think you have taken too much of this medicine contact a poison control center or emergency room at once. NOTE: This medicine is only for you. Do not share this medicine with others. What if I miss a dose? If you miss a dose, skip the missed dose and take your next tablet at the regular time. Do not take double or extra doses. What may interact with this medicine? Do not take this medicine with any of the following medications:  linezolid  MAOIs like Azilect, Carbex, Eldepryl, Marplan, Nardil, and Parnate  methylene blue (injected into a vein)  other medicines that contain bupropion like Zyban This medicine may also interact with the following medications:  alcohol  certain medicines for anxiety or sleep  certain medicines for blood pressure like metoprolol, propranolol  certain medicines for depression or psychotic disturbances  certain medicines for HIV or AIDS like efavirenz, lopinavir, nelfinavir, ritonavir  certain medicines for irregular heart beat like propafenone, flecainide  certain medicines for Parkinson's disease like amantadine, levodopa  certain medicines for seizures like carbamazepine, phenytoin, phenobarbital  cimetidine  clopidogrel  cyclophosphamide  digoxin  furazolidone  isoniazid  nicotine  orphenadrine  procarbazine  steroid medicines like prednisone or cortisone  stimulant medicines for attention disorders, weight loss, or to stay awake  tamoxifen  theophylline  thiotepa  ticlopidine  tramadol  warfarin This list may not describe all possible interactions. Give your health care provider a list of all the medicines, herbs, non-prescription drugs, or dietary supplements you use. Also tell them if you smoke, drink alcohol, or use illegal drugs. Some items may interact  with your medicine. What should I watch for while using this medicine? Tell your doctor if your symptoms do not get better or if they  get worse. Visit your doctor or healthcare provider for regular checks on your progress. Because it may take several weeks to see the full effects of this medicine, it is important to continue your treatment as prescribed by your doctor. This medicine may cause serious skin reactions. They can happen weeks to months after starting the medicine. Contact your healthcare provider right away if you notice fevers or flu-like symptoms with a rash. The rash may be red or purple and then turn into blisters or peeling of the skin. Or, you might notice a red rash with swelling of the face, lips or lymph nodes in your neck or under your arms. Patients and their families should watch out for new or worsening thoughts of suicide or depression. Also watch out for sudden changes in feelings such as feeling anxious, agitated, panicky, irritable, hostile, aggressive, impulsive, severely restless, overly excited and hyperactive, or not being able to sleep. If this happens, especially at the beginning of treatment or after a change in dose, call your healthcare provider. Avoid alcoholic drinks while taking this medicine. Drinking large amounts of alcoholic beverages, using sleeping or anxiety medicines, or quickly stopping the use of these agents while taking this medicine may increase your risk for a seizure. Do not drive or use heavy machinery until you know how this medicine affects you. This medicine can impair your ability to perform these tasks. Do not take this medicine close to bedtime. It may prevent you from sleeping. Your mouth may get dry. Chewing sugarless gum or sucking hard candy, and drinking plenty of water may help. Contact your doctor if the problem does not go away or is severe. The tablet shell for some brands of this medicine does not dissolve. This is normal. The tablet shell may appear whole in the stool. This is not a cause for concern. What side effects may I notice from receiving this medicine? Side  effects that you should report to your doctor or health care professional as soon as possible:  allergic reactions like skin rash, itching or hives, swelling of the face, lips, or tongue  breathing problems  changes in vision  confusion  elevated mood, decreased need for sleep, racing thoughts, impulsive behavior  fast or irregular heartbeat  hallucinations, loss of contact with reality  increased blood pressure  rash, fever, and swollen lymph nodes  redness, blistering, peeling or loosening of the skin, including inside the mouth  seizures  suicidal thoughts or other mood changes  unusually weak or tired  vomiting Side effects that usually do not require medical attention (report to your doctor or health care professional if they continue or are bothersome):  constipation  headache  loss of appetite  nausea  tremors  weight loss This list may not describe all possible side effects. Call your doctor for medical advice about side effects. You may report side effects to FDA at 1-800-FDA-1088. Where should I keep my medicine? Keep out of the reach of children. Store at room temperature between 15 and 30 degrees C (59 and 86 degrees F). Throw away any unused medicine after the expiration date. NOTE: This sheet is a summary. It may not cover all possible information. If you have questions about this medicine, talk to your doctor, pharmacist, or health care provider.  2020 Elsevier/Gold Standard (2018-06-27 13:45:31) Managing Your Hypertension Hypertension  is commonly called high blood pressure. This is when the force of your blood pressing against the walls of your arteries is too strong. Arteries are blood vessels that carry blood from your heart throughout your body. Hypertension forces the heart to work harder to pump blood, and may cause the arteries to become narrow or stiff. Having untreated or uncontrolled hypertension can cause heart attack, stroke, kidney  disease, and other problems. What are blood pressure readings? A blood pressure reading consists of a higher number over a lower number. Ideally, your blood pressure should be below 120/80. The first ("top") number is called the systolic pressure. It is a measure of the pressure in your arteries as your heart beats. The second ("bottom") number is called the diastolic pressure. It is a measure of the pressure in your arteries as the heart relaxes. What does my blood pressure reading mean? Blood pressure is classified into four stages. Based on your blood pressure reading, your health care provider may use the following stages to determine what type of treatment you need, if any. Systolic pressure and diastolic pressure are measured in a unit called mm Hg. Normal  Systolic pressure: below 120.  Diastolic pressure: below 80. Elevated  Systolic pressure: 120-129.  Diastolic pressure: below 80. Hypertension stage 1  Systolic pressure: 130-139.  Diastolic pressure: 80-89. Hypertension stage 2  Systolic pressure: 140 or above.  Diastolic pressure: 90 or above. What health risks are associated with hypertension? Managing your hypertension is an important responsibility. Uncontrolled hypertension can lead to:  A heart attack.  A stroke.  A weakened blood vessel (aneurysm).  Heart failure.  Kidney damage.  Eye damage.  Metabolic syndrome.  Memory and concentration problems. What changes can I make to manage my hypertension? Hypertension can be managed by making lifestyle changes and possibly by taking medicines. Your health care provider will help you make a plan to bring your blood pressure within a normal range. Eating and drinking   Eat a diet that is high in fiber and potassium, and low in salt (sodium), added sugar, and fat. An example eating plan is called the DASH (Dietary Approaches to Stop Hypertension) diet. To eat this way: ? Eat plenty of fresh fruits and  vegetables. Try to fill half of your plate at each meal with fruits and vegetables. ? Eat whole grains, such as whole wheat pasta, brown rice, or whole grain bread. Fill about one quarter of your plate with whole grains. ? Eat low-fat diary products. ? Avoid fatty cuts of meat, processed or cured meats, and poultry with skin. Fill about one quarter of your plate with lean proteins such as fish, chicken without skin, beans, eggs, and tofu. ? Avoid premade and processed foods. These tend to be higher in sodium, added sugar, and fat.  Reduce your daily sodium intake. Most people with hypertension should eat less than 1,500 mg of sodium a day.  Limit alcohol intake to no more than 1 drink a day for nonpregnant women and 2 drinks a day for men. One drink equals 12 oz of beer, 5 oz of wine, or 1 oz of hard liquor. Lifestyle  Work with your health care provider to maintain a healthy body weight, or to lose weight. Ask what an ideal weight is for you.  Get at least 30 minutes of exercise that causes your heart to beat faster (aerobic exercise) most days of the week. Activities may include walking, swimming, or biking.  Include exercise to strengthen your  muscles (resistance exercise), such as weight lifting, as part of your weekly exercise routine. Try to do these types of exercises for 30 minutes at least 3 days a week.  Do not use any products that contain nicotine or tobacco, such as cigarettes and e-cigarettes. If you need help quitting, ask your health care provider.  Control any long-term (chronic) conditions you have, such as high cholesterol or diabetes. Monitoring  Monitor your blood pressure at home as told by your health care provider. Your personal target blood pressure may vary depending on your medical conditions, your age, and other factors.  Have your blood pressure checked regularly, as often as told by your health care provider. Working with your health care provider  Review all  the medicines you take with your health care provider because there may be side effects or interactions.  Talk with your health care provider about your diet, exercise habits, and other lifestyle factors that may be contributing to hypertension.  Visit your health care provider regularly. Your health care provider can help you create and adjust your plan for managing hypertension. Will I need medicine to control my blood pressure? Your health care provider may prescribe medicine if lifestyle changes are not enough to get your blood pressure under control, and if:  Your systolic blood pressure is 130 or higher.  Your diastolic blood pressure is 80 or higher. Take medicines only as told by your health care provider. Follow the directions carefully. Blood pressure medicines must be taken as prescribed. The medicine does not work as well when you skip doses. Skipping doses also puts you at risk for problems. Contact a health care provider if:  You think you are having a reaction to medicines you have taken.  You have repeated (recurrent) headaches.  You feel dizzy.  You have swelling in your ankles.  You have trouble with your vision. Get help right away if:  You develop a severe headache or confusion.  You have unusual weakness or numbness, or you feel faint.  You have severe pain in your chest or abdomen.  You vomit repeatedly.  You have trouble breathing. Summary  Hypertension is when the force of blood pumping through your arteries is too strong. If this condition is not controlled, it may put you at risk for serious complications.  Your personal target blood pressure may vary depending on your medical conditions, your age, and other factors. For most people, a normal blood pressure is less than 120/80.  Hypertension is managed by lifestyle changes, medicines, or both. Lifestyle changes include weight loss, eating a healthy, low-sodium diet, exercising more, and limiting  alcohol. This information is not intended to replace advice given to you by your health care provider. Make sure you discuss any questions you have with your health care provider. Document Revised: 07/26/2018 Document Reviewed: 03/01/2016 Elsevier Patient Education  2020 [employer].

## 2020-02-24 ENCOUNTER — Encounter (INDEPENDENT_AMBULATORY_CARE_PROVIDER_SITE_OTHER): Payer: Self-pay | Admitting: Primary Care

## 2020-02-24 ENCOUNTER — Ambulatory Visit (INDEPENDENT_AMBULATORY_CARE_PROVIDER_SITE_OTHER): Payer: Self-pay | Admitting: Primary Care

## 2020-02-24 ENCOUNTER — Other Ambulatory Visit: Payer: Self-pay

## 2020-02-24 VITALS — BP 206/130 | HR 58 | Temp 97.7°F | Ht 69.0 in | Wt 144.2 lb

## 2020-02-24 DIAGNOSIS — I160 Hypertensive urgency: Secondary | ICD-10-CM

## 2020-02-24 MED ORDER — CLONIDINE HCL 0.1 MG PO TABS
0.2000 mg | ORAL_TABLET | Freq: Once | ORAL | Status: AC
  Administered 2020-02-24: 0.2 mg via ORAL

## 2020-02-24 NOTE — Patient Instructions (Signed)
Medication management #1 amlodipine 10 mg in the morning  #2 losartan 100 mg daily #3 labetalol 300 mg twice daily Patient to locate medication when he gets home and call back with what he is actually taking.  At that time we will determine what medication is missing in this new regimen

## 2020-02-24 NOTE — Progress Notes (Signed)
[facility]   [name] is a 59 year old male who presents for  hypertension evaluation, on previous visit medication was adjusted to include Labetalol 300 mg twice daily and to continue losartan 100 mg daily and amlodipine 10 mg daily.  Blood pressure today is 203/126.  Stated to patient that there is no way he is taking all 3 of his blood pressure medications with this blood pressure reading.  At this time patient informed me he is taking 2 blood pressure medicines and one of them is twice a day.  Does not know the names of the medications therefore unable to Adjust medication at this time until he is taking medication as prescribed. Patient reports adherence with medications.  Current Medication List Current Outpatient Medications on File Prior to Visit  Medication Sig Dispense Refill   amLODipine (NORVASC) 10 MG tablet Take 1 tablet (10 mg total) by mouth daily. 90 tablet 1   buPROPion (WELLBUTRIN SR) 150 MG 12 hr tablet Take 1 tablet (150 mg total) by mouth 2 (two) times daily. 60 tablet 3   clopidogrel (PLAVIX) 75 MG tablet Take 1 tablet (75 mg total) by mouth daily. 30 tablet 0   labetalol (NORMODYNE) 300 MG tablet Take 1 tablet (300 mg total) by mouth 2 (two) times daily. 60 tablet 3   losartan (COZAAR) 100 MG tablet Take 1 tablet (100 mg total) by mouth daily. 90 tablet 3   atorvastatin (LIPITOR) 40 MG tablet Take 1 tablet (40 mg total) by mouth daily. (Patient not taking: Reported on 01/20/2020) 30 tablet 0   No current facility-administered medications on file prior to visit.    Past Medical History  Past Medical History:  Diagnosis Date   Hypertension    Dietary habits include: decreased sausage intake  Exercise habits include:walk  Family / Social history: brother CVA  ASCVD risk factors include- [place]  O:  Physical Exam Vitals reviewed.  HENT:     Head: Normocephalic.     Nose: Nose normal.  Cardiovascular:     Rate and Rhythm:  Normal rate and regular rhythm.  Pulmonary:     Effort: Pulmonary effort is normal.     Breath sounds: Normal breath sounds.  Abdominal:     General: Abdomen is flat.     Palpations: Abdomen is soft.  Musculoskeletal:        General: Normal range of motion.     Cervical back: Normal range of motion.  Skin:    General: Skin is warm and dry.  Neurological:     Mental Status: He is alert and oriented to person, place, and time.  Psychiatric:        Mood and Affect: Mood normal.        Behavior: Behavior normal.        Thought Content: Thought content normal.        Judgment: Judgment normal.      Review of Systems  Psychiatric/Behavioral:       Alcohol and cigarette smoking  All other systems reviewed and are negative.   Last 3 Office BP readings: BP Readings from Last 3 Encounters:  02/24/20 (!) 203/126  02/03/20 (!) 191/127  01/20/20 (!) 215/137    BMET    Component Value Date/Time   NA 141 01/20/2020 1557   K 4.4 01/20/2020 1557   CL 103 01/20/2020 1557   CO2 25 01/20/2020 1557   GLUCOSE 104 (H) 01/20/2020 1557   GLUCOSE 104 (H) 12/13/2019 0149  BUN 14 01/20/2020 1557   CREATININE 1.22 01/20/2020 1557   CALCIUM 9.5 01/20/2020 1557   GFRNONAA 65 01/20/2020 1557   GFRAA 76 01/20/2020 1557    Renal function: CrCl cannot be calculated (Patient's most recent lab result is older than the maximum 21 days allowed.).  Clinical ASCVD: Yes  The ASCVD Risk score [name], et al., 2013) failed to calculate for the following reasons:   The patient has a prior MI or stroke diagnosis   A/P: Hypertension longstanding current medications unknown . BP Goal = 130/82mmHg. Patient is not adherent with current medications.  -Continued  -F/u labs ordered - none  -Counseled on lifestyle modifications for blood pressure control including reduced dietary sodium, increased exercise, adequate sleep  [name]

## 2020-03-04 NOTE — Telephone Encounter (Signed)
Received documentation from [employer] that Pt has requested a cancellation of monitoring service.   Order Cancelled...  

## 2020-03-05 ENCOUNTER — Emergency Department (HOSPITAL_COMMUNITY): Payer: Self-pay

## 2020-03-05 ENCOUNTER — Observation Stay (HOSPITAL_COMMUNITY)
Admission: EM | Admit: 2020-03-05 | Discharge: 2020-03-06 | Disposition: A | Discharge status: Home / Self Care | Payer: Self-pay | Attending: Internal Medicine | Admitting: Internal Medicine

## 2020-03-05 DIAGNOSIS — Z515 Encounter for palliative care: Secondary | ICD-10-CM

## 2020-03-05 DIAGNOSIS — F17210 Nicotine dependence, cigarettes, uncomplicated: Secondary | ICD-10-CM | POA: Insufficient documentation

## 2020-03-05 DIAGNOSIS — I610 Nontraumatic intracerebral hemorrhage in hemisphere, subcortical: Principal | ICD-10-CM

## 2020-03-05 DIAGNOSIS — I10 Essential (primary) hypertension: Secondary | ICD-10-CM | POA: Insufficient documentation

## 2020-03-05 DIAGNOSIS — Z7189 Other specified counseling: Secondary | ICD-10-CM

## 2020-03-05 DIAGNOSIS — I629 Nontraumatic intracranial hemorrhage, unspecified: Secondary | ICD-10-CM

## 2020-03-05 DIAGNOSIS — Z7902 Long term (current) use of antithrombotics/antiplatelets: Secondary | ICD-10-CM | POA: Insufficient documentation

## 2020-03-05 DIAGNOSIS — Z79899 Other long term (current) drug therapy: Secondary | ICD-10-CM | POA: Insufficient documentation

## 2020-03-05 DIAGNOSIS — I619 Nontraumatic intracerebral hemorrhage, unspecified: Secondary | ICD-10-CM | POA: Insufficient documentation

## 2020-03-05 DIAGNOSIS — Z20822 Contact with and (suspected) exposure to covid-19: Secondary | ICD-10-CM | POA: Insufficient documentation

## 2020-03-05 LAB — COMPREHENSIVE METABOLIC PANEL
ALT: 27 U/L (ref 0–44)
AST: 31 U/L (ref 15–41)
Albumin: 4.3 g/dL (ref 3.5–5.0)
Alkaline Phosphatase: 103 U/L (ref 38–126)
Anion gap: 13 (ref 5–15)
BUN: 13 mg/dL (ref 6–20)
CO2: 21 mmol/L — ABNORMAL LOW (ref 22–32)
Calcium: 9.5 mg/dL (ref 8.9–10.3)
Chloride: 106 mmol/L (ref 98–111)
Creatinine, Ser: 1.68 mg/dL — ABNORMAL HIGH (ref 0.61–1.24)
GFR, Estimated: 47 mL/min — ABNORMAL LOW (ref >60)
Glucose, Bld: 160 mg/dL — ABNORMAL HIGH (ref 70–99)
Potassium: 4.3 mmol/L (ref 3.5–5.1)
Sodium: 140 mmol/L (ref 135–145)
Total Bilirubin: 1.2 mg/dL (ref 0.3–1.2)
Total Protein: 8 g/dL (ref 6.5–8.1)

## 2020-03-05 LAB — I-STAT CHEM 8, ED
BUN: 17 mg/dL (ref 6–20)
Calcium, Ion: 1.08 mmol/L — ABNORMAL LOW (ref 1.15–1.40)
Chloride: 107 mmol/L (ref 98–111)
Creatinine, Ser: 1.5 mg/dL — ABNORMAL HIGH (ref 0.61–1.24)
Glucose, Bld: 159 mg/dL — ABNORMAL HIGH (ref 70–99)
HCT: 51 % (ref 39.0–52.0)
Hemoglobin: 17.3 g/dL — ABNORMAL HIGH (ref 13.0–17.0)
Potassium: 4.3 mmol/L (ref 3.5–5.1)
Sodium: 141 mmol/L (ref 135–145)
TCO2: 22 mmol/L (ref 22–32)

## 2020-03-05 LAB — CBC
HCT: 48.3 % (ref 39.0–52.0)
Hemoglobin: 15.9 g/dL (ref 13.0–17.0)
MCH: 30.1 pg (ref 26.0–34.0)
MCHC: 32.9 g/dL (ref 30.0–36.0)
MCV: 91.5 fL (ref 80.0–100.0)
Platelets: 338 10*3/uL (ref 150–400)
RBC: 5.28 MIL/uL (ref 4.22–5.81)
RDW: 14.6 % (ref 11.5–15.5)
WBC: 13 10*3/uL — ABNORMAL HIGH (ref 4.0–10.5)
nRBC: 0 % (ref 0.0–0.2)

## 2020-03-05 LAB — DIFFERENTIAL
Abs Immature Granulocytes: 0.04 10*3/uL (ref 0.00–0.07)
Basophils Absolute: 0.1 10*3/uL (ref 0.0–0.1)
Basophils Relative: 0 %
Eosinophils Absolute: 0 10*3/uL (ref 0.0–0.5)
Eosinophils Relative: 0 %
Immature Granulocytes: 0 %
Lymphocytes Relative: 6 %
Lymphs Abs: 0.7 10*3/uL (ref 0.7–4.0)
Monocytes Absolute: 0.9 10*3/uL (ref 0.1–1.0)
Monocytes Relative: 7 %
Neutro Abs: 11.4 10*3/uL — ABNORMAL HIGH (ref 1.7–7.7)
Neutrophils Relative %: 87 %

## 2020-03-05 LAB — PROTIME-INR
INR: 1 (ref 0.8–1.2)
Prothrombin Time: 13 seconds (ref 11.4–15.2)

## 2020-03-05 LAB — RESPIRATORY PANEL BY RT PCR (FLU A&B, COVID)
Influenza A by PCR: NEGATIVE
Influenza B by PCR: NEGATIVE
SARS Coronavirus 2 by RT PCR: NEGATIVE

## 2020-03-05 LAB — TYPE AND SCREEN
ABO/RH(D): O POS
Antibody Screen: NEGATIVE

## 2020-03-05 LAB — CBG MONITORING, ED: Glucose-Capillary: 146 mg/dL — ABNORMAL HIGH (ref 70–99)

## 2020-03-05 LAB — ABO/RH: ABO/RH(D): O POS

## 2020-03-05 LAB — APTT: aPTT: 29 seconds (ref 24–36)

## 2020-03-05 MED ORDER — LABETALOL HCL 5 MG/ML IV SOLN
10.0000 mg | INTRAVENOUS | Status: DC | PRN
  Administered 2020-03-05 (×9): 10 mg via INTRAVENOUS
  Filled 2020-03-05 (×5): qty 4

## 2020-03-05 MED ORDER — SODIUM CHLORIDE 0.9% IV SOLUTION: Freq: Once | INTRAVENOUS | Status: DC

## 2020-03-05 MED ORDER — LORAZEPAM 2 MG/ML PO CONC: 1.0000 mg | ORAL | Status: DC | PRN

## 2020-03-05 MED ORDER — ACETAMINOPHEN 325 MG PO TABS: 650.0000 mg | ORAL_TABLET | Freq: Four times a day (QID) | ORAL | Status: DC | PRN

## 2020-03-05 MED ORDER — MORPHINE SULFATE (PF) 2 MG/ML IV SOLN
1.0000 mg | INTRAVENOUS | Status: DC | PRN
  Administered 2020-03-05 – 2020-03-06 (×2): 1 mg via INTRAVENOUS
  Filled 2020-03-05 (×2): qty 1

## 2020-03-05 MED ORDER — CLEVIDIPINE BUTYRATE 0.5 MG/ML IV EMUL
0.0000 mg/h | INTRAVENOUS | Status: DC
  Administered 2020-03-05: 32 mg/h via INTRAVENOUS
  Administered 2020-03-05: 2 mg/h via INTRAVENOUS
  Filled 2020-03-05 (×3): qty 50

## 2020-03-05 MED ORDER — LORAZEPAM 2 MG/ML IJ SOLN
1.0000 mg | INTRAMUSCULAR | Status: DC | PRN
  Administered 2020-03-05: 1 mg via INTRAVENOUS
  Filled 2020-03-05: qty 1

## 2020-03-05 MED ORDER — MORPHINE SULFATE (CONCENTRATE) 10 MG/0.5ML PO SOLN: 5.0000 mg | ORAL | Status: DC | PRN

## 2020-03-05 MED ORDER — LORAZEPAM 1 MG PO TABS: 1.0000 mg | ORAL_TABLET | ORAL | Status: DC | PRN

## 2020-03-05 MED ORDER — IOHEXOL 350 MG/ML SOLN
75.0000 mL | Freq: Once | INTRAVENOUS | Status: AC | PRN
  Administered 2020-03-05: 75 mL via INTRAVENOUS

## 2020-03-05 MED ORDER — SODIUM CHLORIDE 0.9% FLUSH
3.0000 mL | Freq: Once | INTRAVENOUS | Status: DC
Start: 2020-03-05 — End: 2020-03-06

## 2020-03-05 MED ORDER — ACETAMINOPHEN 650 MG RE SUPP
650.0000 mg | Freq: Four times a day (QID) | RECTAL | Status: DC | PRN
  Administered 2020-03-05: 650 mg via RECTAL
  Filled 2020-03-05: qty 1

## 2020-03-05 MED ORDER — SODIUM CHLORIDE 0.9 % IV SOLN
25.0000 ug | Freq: Once | INTRAVENOUS | Status: AC
  Administered 2020-03-05: 25 ug via INTRAVENOUS
  Filled 2020-03-05: qty 6.25

## 2020-03-05 NOTE — Code Documentation (Addendum)
Pt is a 59 yr old male last known well at midnight last night. His family found him this afternoon nonverbal and not moving the right side at which time EMS was called. Code stroke activated at 1641. Pt arrived to [facility] at 1653. He was non-verbal, stupouous and plegic on the right. His NIHSS was 31. He was extremely hypertensive on arrival and cleviprex gtt was started.. See stroke flowsheet for details. Pt taken for non-contrast CT at 1656. CT reveals large left sided hemorrhage.CTA obtained, which was negative for aneurysm. Pt returned to room 15. DDAVP and platelet infusions ordered as pt was taking plavix. Neurosurgery consulted by Dr Otelia Limes. Family contact obtained and brother Aodhan Scheidt was brought into room 15. See MAR for times of meds and titration of cleviprex gtt and additional antihypertensives.  After conversations with Dr Jake Samples and pt's family, pt will not be a surgical candidate at this time. Dr. Jake Samples cancelled the last four units of platelets, and increased SBP parameter to 150. Pt not candidate for thrombolysis or thrombectomy as stroke is hemorrhagic. Bedside handoff with Milinda Hirschfeld. Pt will need q 1 hr VS, mNIHSS and pupil checks. 4 [place] ICU notified of pending patient.

## 2020-03-05 NOTE — ED Notes (Signed)
Emergency release platelets unit #2 given. Unit #[id number]

## 2020-03-05 NOTE — Progress Notes (Signed)
Per Dr Jake Samples, SBP may be relaxed to <150 and remaining 4 units of platelets do not need to be given.

## 2020-03-05 NOTE — ED Notes (Signed)
Emergency release platelets given unit # [id number]

## 2020-03-05 NOTE — Consult Note (Addendum)
Admission H&P    Chief Complaint: Acute onset of right hemiplegia and depressed level of consciousness  HPI: Gerald Morgan is an 59 y.o. 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 The patient was LKN at midnight 0000 per family communication with EMS on scene. He was sitting on the floor for a while slumped to the right, but family did not feel this was unusual as he often sits on the floor at home. When they tried to communicate with him however, he was noted to be altered, so EMS was called. On arrival he was altered, but did squeeze the paramedics hand to command. He was densely weak on the right  SBP was 290 en route. O2 sat was 94%. On arrival to the ED he was densely hemiplegic on the right and nonverbal.   STAT CT head revealed a massive acute left hemisphere bleed with the anterior inferior tip near the MCA. There was intraventricular extension of blood and mass effect with initial stages of midline shift and enlarged ventricles.   CT head: 1. Large hemorrhage in the left basal ganglia with estimated volume of greater than 70 mL. 2. Extensive mass effect from the hemorrhage in surrounding edema resulting in 12 mm midline shift. 3. Intraventricular extension of the hemorrhage with blood in both lateral ventricles and the third ventricle. 4. Background of diffuse white matter disease again noted.  CTA of head:  - No abnormal enhancement or vascularity in the region of parenchymal hemorrhage. - Multifocal vessel irregularity primarily of medium and small vessels. Left P1-P2 PCA junction high-grade stenosis was present on 12/12/2019 MRA. Additional areas of irregularity and stenosis in anterior and posterior circulations may be progressed since the MRA raising the possibility of a non-atherosclerotic  vasculopathy.  Brother reports that the patient drinks about 4 standard drinks per day. He also smokes.   Home medications include Plavix and atorvastatin.   Past Medical History:  Diagnosis Date  . Hypertension     Past Surgical History:  Procedure Laterality Date  . BRAIN SURGERY    . CRANIOTOMY  09/19/2011   Procedure: CRANIOTOMY HEMATOMA EVACUATION EPIDURAL;  Surgeon: Barnett Abu, MD;  Location: [facility];  Service: Neurosurgery;  Laterality: Left;  Redo craniotomy for evacuation of epidural hematoma  . CRANIOTOMY Left 04/17/2014   Procedure: CRANIOTOMY HEMATOMA EVACUATION SUBDURAL;  Surgeon: Tressie Stalker, MD;  Location: [facility];  Service: Neurosurgery;  Laterality: Left;  left    No family history on file. Social History:  reports that he has been smoking cigarettes. He has been smoking about 0.50 packs per day. He has never used smokeless tobacco. He reports current alcohol use of about 2.0 standard drinks of alcohol per week. He reports current drug use. Drug: Marijuana.  Allergies: No Known Allergies  Home Medications: No current facility-administered medications on file prior to encounter.   Current Outpatient Medications on File Prior to Encounter  Medication Sig Dispense Refill  . amLODipine (NORVASC) 10 MG tablet Take 1 tablet (10 mg total) by mouth daily. 90 tablet 1  . atorvastatin (LIPITOR) 40 MG tablet Take 1 tablet (40 mg total) by mouth daily. (Patient not taking: Reported on 01/20/2020) 30 tablet 0  . buPROPion (WELLBUTRIN SR) 150 MG 12 hr tablet Take 1 tablet (150 mg total) by mouth 2 (two) times daily.  60 tablet 3  . clopidogrel (PLAVIX) 75 MG tablet Take 1 tablet (75 mg total) by mouth daily. 30 tablet 0  . labetalol (NORMODYNE) 300 MG tablet Take 1 tablet (300 mg total) by mouth 2 (two) times daily. 60 tablet 3  . losartan (COZAAR) 100 MG tablet Take 1 tablet (100 mg total) by mouth daily. 90 tablet 3     ROS: Unable to obtain due to obtundation.    Physical Examination: There were no vitals taken for this visit.  HEENT-  Normocephalic. Bruise to right cheekbone noted. Lungs - Sonorous respirations alternating with non-labored breathing Extremities - No edema  Neurologic Examination: Ment: Obtunded. Nonverbal. Will semi-purposefully move LUE. Nonverbal. Did follow one command: squeezing of examiner's hand.  CN: PERRL 3 mm >> 2 mm. No blink to threat. Not tracking visually. Eyes deviated to the left and not crossing midline to the right; this can be overcome with oculocephalic maneuver. No nystagmus. Right facial droop. Reacts to brow ridge pressure more on the left than right.  Motor/Sensory:  RUE and RLE flaccid with no movement to noxious.  LUE with semipurposeful movements and movement to stimulation.  LLE withdraws briskly to noxious plantar stimulation.  Reflexes: 2+ bilateral brachioradialis, 2+ right patellar, 3+ left patellar Cerebellar/Gait: Unable to assess  Results for orders placed or performed during the hospital encounter of 03/05/20 (from the past 48 hour(s))  CBG monitoring, ED     Status: Abnormal   Collection Time: 03/05/20  4:54 PM  Result Value Ref Range   Glucose-Capillary 146 (H) 70 - 99 mg/dL    Comment: Glucose reference range applies only to samples taken after fasting for at least 8 hours.   No results found.  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 The patient is obtunded with dense right sided weakness.  2. Daily EtOH use of about 4 standard drinks. History of smoking.  3. Patient was administered DDAVP and platelet transfusion was started in anticipation of craniotomy and/or ventriculostomy. However, the the family has decided not to pursue aggressive care.  4. Family would like palliative care to be involved. They have stated that the patient would not want to live with this degree of disability 5.  Discontinuing platelet transfusion  Recommendations: -- Will need admission to the medical floor -- Pain management per Palliative team  50 minutes spent in the emergent neurological evaluation and management of this critically ill patient  Electronically signed: Dr. Caryl Pina 03/05/2020, 5:05 PM

## 2020-03-05 NOTE — ED Provider Notes (Signed)
[facility] EMERGENCY DEPARTMENT Provider Note   CSN: [id number] Arrival date & time: 03/05/20  1653  [facility] emergency [facility] this [facility] patient at 1655.  History Chief Complaint  Patient presents with  . Code Stroke    Barnett Hatterverian B Pawloski is a 59 y.o. male.  HPI Patient seen and evaluated on arrival, with our neurology colleagues. Patient arrives as a code stroke, having been found with abnormal interactivity by family members approximately 17 hours after last being seen normal. Level 5 caveat secondary to acuity of condition/change in mental status. Seemingly the patient has history of multiple medical issues including hypertension, stroke, possibly intracranial hemorrhage. After being seen normal about 17 hours prior to ED arrival, he was just found prior to transfer, with new right-sided weakness, facial asymmetry, and aphasia. Per EMS the patient was hypertensive in route to with a systolic greater than 290. Patient self cannot provide any details of his history.    Past Medical History:  Diagnosis Date  . Hypertension     Patient Active Problem List   Diagnosis Date Noted  . Cerebral thrombosis with cerebral infarction 12/12/2019  . Hypertensive urgency 12/09/2019  . Facial trauma 04/17/2014  . Subdural hematoma caused by concussion (HCC) 04/17/2014    Past Surgical History:  Procedure Laterality Date  . BRAIN SURGERY    . CRANIOTOMY  09/19/2011   Procedure: CRANIOTOMY HEMATOMA EVACUATION EPIDURAL;  Surgeon: Barnett AbuHenry Elsner, MD;  Location: [facility];  Service: Neurosurgery;  Laterality: Left;  Redo craniotomy for evacuation of epidural hematoma  . CRANIOTOMY Left 04/17/2014   Procedure: CRANIOTOMY HEMATOMA EVACUATION SUBDURAL;  Surgeon: Tressie StalkerJeffrey Jenkins, MD;  Location: [facility];  Service: Neurosurgery;  Laterality: Left;  left       No family history on file.  Social History   Tobacco Use   . Smoking status: Current Every Day Smoker    Packs/day: 0.50    Types: Cigarettes  . Smokeless tobacco: Never Used  Vaping Use  . Vaping Use: Never used  Substance Use Topics  . Alcohol use: Yes    Alcohol/week: 2.0 standard drinks    Types: 2 Cans of beer per week    Comment: 2 40oz a couple of times a week  . Drug use: Yes    Types: Marijuana    Comment: 2 weeks ago    Home Medications Prior to Admission medications   Medication Sig Start Date End Date Taking? Authorizing Provider  amLODipine (NORVASC) 10 MG tablet Take 1 tablet (10 mg total) by mouth daily. 01/20/20   Grayce SessionsEdwards, Michelle P, NP  atorvastatin (LIPITOR) 40 MG tablet Take 1 tablet (40 mg total) by mouth daily. Patient not taking: Reported on 01/20/2020 12/14/19   Marguerita MerlesSheikh, Omair Latif, DO  buPROPion [facility](WELLBUTRIN SR) 150 MG 12 hr tablet Take 1 tablet (150 mg total) by mouth 2 (two) times daily. 02/03/20   Grayce SessionsEdwards, Michelle P, NP  clopidogrel (PLAVIX) 75 MG tablet Take 1 tablet (75 mg total) by mouth daily. 01/21/20   Grayce SessionsEdwards, Michelle P, NP  labetalol (NORMODYNE) 300 MG tablet Take 1 tablet (300 mg total) by mouth 2 (two) times daily. 02/03/20   Grayce SessionsEdwards, Michelle P, NP  losartan (COZAAR) 100 MG tablet Take 1 tablet (100 mg total) by mouth daily. 01/20/20   Grayce SessionsEdwards, Michelle P, NP    Allergies    Patient has no known allergies.  Review of Systems   Review of Systems  Unable  to perform ROS: Acuity of condition    Physical Exam Updated Vital Signs BP (!) 158/103   Pulse 93   Temp 98.5 F (36.9 C) (Axillary)   Resp (!) 26   Ht 6' (1.829 m)   Wt 63.7 kg   SpO2 99%   BMI 19.05 kg/m   Physical Exam Vitals and nursing note reviewed.  Constitutional:      Appearance: He is well-developed.     Comments: Withdrawn, essentially noninteractive aside from occasional left sided motion sickly appearing adult male.  HENT:     Head: Normocephalic and atraumatic.     Comments: Supraglottic airway in place Eyes:      Conjunctiva/sclera: Conjunctivae normal.  Cardiovascular:     Rate and Rhythm: Normal rate and regular rhythm.  Pulmonary:     Effort: Pulmonary effort is normal. No respiratory distress.     Breath sounds: No stridor.  Abdominal:     General: There is no distension.  Skin:    General: Skin is warm and dry.  Neurological:     Motor: Abnormal muscle tone present.     Coordination: Coordination abnormal.     Comments: Right-sided hemiplegia, patient moves left upper and lower extremity spontaneously, and to command less so. Patient is essentially nonverbal, face is asymmetric      ED Results / Procedures / Treatments   Labs (all labs ordered are listed, but only abnormal results are displayed) Labs Reviewed  CBC - Abnormal; Notable for the following components:      Result Value   WBC 13.0 (*)    All other components within normal limits  DIFFERENTIAL - Abnormal; Notable for the following components:   Neutro Abs 11.4 (*)    All other components within normal limits  COMPREHENSIVE METABOLIC PANEL - Abnormal; Notable for the following components:   CO2 21 (*)    Glucose, Bld 160 (*)    Creatinine, Ser 1.68 (*)    GFR, Estimated 47 (*)    All other components within normal limits  CBG MONITORING, ED - Abnormal; Notable for the following components:   Glucose-Capillary 146 (*)    All other components within normal limits  I-STAT CHEM 8, ED - Abnormal; Notable for the following components:   Creatinine, Ser 1.50 (*)    Glucose, Bld 159 (*)    Calcium, Ion 1.08 (*)    Hemoglobin 17.3 (*)    All other components within normal limits  PROTIME-INR  APTT  CBG MONITORING, ED  TYPE AND SCREEN  PREPARE PLATELET PHERESIS  ABO/RH    Radiology CT Code Stroke CTA Head W/WO contrast  Result Date: 03/05/2020 CLINICAL DATA:  Intracranial hemorrhage EXAM: CT ANGIOGRAPHY HEAD TECHNIQUE: Multidetector CT imaging of the head was performed using the standard protocol during bolus  administration of intravenous contrast. Multiplanar CT image reconstructions and MIPs were obtained to evaluate the vascular anatomy. CONTRAST:  40mL OMNIPAQUE IOHEXOL 350 MG/ML SOLN COMPARISON:  12/12/2019 FINDINGS: CTA HEAD Anterior circulation: Intracranial internal carotid arteries are patent with mild calcified plaque. Anterior and middle cerebral arteries are patent. There is multifocal irregularity with greatest involvement of left MCA and bilateral distal ACA branches with areas of stenosis ranging from mild to marked. There is no abnormal enhancement or vascularity in the region of parenchymal hemorrhage. Posterior circulation: Intracranial vertebral arteries, basilar artery, and posterior cerebral arteries are patent. There is high-grade stenosis near the left P1/P2 PCA junction, which was present on prior MRA. Irregularity of the right  P2 and P3 PCA. Venous sinuses: Not well evaluated. IMPRESSION: No abnormal enhancement or vascularity in the region of parenchymal hemorrhage. Multifocal vessel irregularity primarily of medium and small vessels. Left P1-P2 PCA junction high-grade stenosis was present on 12/12/2019 MRA. Additional areas of irregularity and stenosis in anterior and posterior circulations may be progressed since the MRA raising the possibility of a non-atherosclerotic vasculopathy. Electronically Signed   By: Guadlupe Spanish M.D.   On: 03/05/2020 17:31   CT HEAD CODE STROKE WO CONTRAST  Result Date: 03/05/2020 CLINICAL DATA:  Code stroke.  Right-sided paralysis. EXAM: CT HEAD WITHOUT CONTRAST TECHNIQUE: Contiguous axial images were obtained from the base of the skull through the vertex without intravenous contrast. COMPARISON:  CT head without contrast 12/08/2019 FINDINGS: Brain: Large left basal ganglia hemorrhage measures 5.5 x 2.8 x 9.5 cm. Estimated volume is greater than the 70 mL. Intraventricular extension is present. Extensive edema and mass effect is present with midline shift up  to 12 mm. Blood is seen in both lateral ventricles and in the third ventricle. Diffuse white matter hypoattenuation is again seen otherwise. Asymmetric hypoattenuation in the left hemisphere represents edema. No extra-axial hemorrhage is present. Vascular: Atherosclerotic calcifications are present in the cavernous internal carotid arteries bilaterally. No hyperdense vessel is present. Skull: Left craniotomy noted.  Calvarium otherwise intact. Sinuses/Orbits: Extensive paranasal mucosal thickening is present. The right sphenoid sinus is near completely opacified, chronic finding. Fluid level is present in the left maxillary sinus. Nasal intubation is noted on the left. The globes and orbits are within normal limits. IMPRESSION: 1. Large hemorrhage in the left basal ganglia with estimated volume of greater than 70 mL. 2. Extensive mass effect from the hemorrhage in surrounding edema resulting in 12 mm midline shift. 3. Intraventricular extension of the hemorrhage with blood in both lateral ventricles and the third ventricle. 4. Background of diffuse white matter disease again noted. Critical Value/emergent results were called by telephone at the time of interpretation on 03/05/2020 at 5:17 pm to provider Gerhard Munch , who verbally acknowledged these results. Electronically Signed   By: Marin Roberts M.D.   On: 03/05/2020 17:20    Procedures Procedures (including critical care time)  Medications Ordered in ED Medications  sodium chloride flush (NS) 0.9 % injection 3 mL (has no administration in time range)  clevidipine (CLEVIPREX) infusion 0.5 mg/mL (16 mg/hr Intravenous Rate/Dose Verify 03/05/20 1728)  0.9 %  sodium chloride infusion (Manually program via Guardrails IV Fluids) (has no administration in time range)  labetalol (NORMODYNE) injection 10 mg (10 mg Intravenous Given 03/05/20 1810)  iohexol (OMNIPAQUE) 350 MG/ML injection 75 mL (75 mLs Intravenous Contrast Given 03/05/20 1717)   desmopressin (DDAVP) 25 mcg in sodium chloride 0.9 % 50 mL IVPB (0 mcg Intravenous Stopped 03/05/20 1806)    ED Course  I have reviewed the triage vital signs and the nursing notes.  Pertinent labs & imaging results that were available during my care of the patient were reviewed by me and considered in my medical decision making (see chart for details).   Patient's initial evaluation continued in the CT scan, as he was prior to receive Cleviprex given his substantial hypertension on arrival. Images there notable for large intracranial hemorrhage.  Subsequently discussed this with our neurology colleagues, radiology colleagues Patient did not have basal ganglia hemorrhage volume greater than 70 mL with associated mass-effect, midline shift, and intraventricular extension.  Update:, Patient's case discussed with neurology. Blood pressure has improved substantially on Cleviprex.  On further consultation with neurology, neurosurgery, the patient will require admission to the ICU. Family is involved in goals of care discussion, consideration of surgery.  7:12 PM Family has been discussing the patient's findings with our neurology colleagues, decision has been made for palliative only, withdrawal of care.  Given this, though the patient has intracranial hemorrhage, he will be admitted to our internal medicine colleagues for end-of-life efforts.  MDM Rules/Calculators/A&P MDM Number of Diagnoses or Management Options Basal ganglia hemorrhage (HCC): new, needed workup   Amount and/or Complexity of Data Reviewed Clinical lab tests: reviewed Tests in the radiology section of CPT: reviewed Tests in the medicine section of CPT: reviewed Discussion of test results with the performing providers: yes Decide to obtain previous medical records or to obtain history from someone other than the patient: yes Obtain history from someone other than the patient: yes Review and summarize past medical  records: yes Discuss the patient with other providers: yes Independent visualization of images, tracings, or specimens: yes  Risk of Complications, Morbidity, and/or Mortality Presenting problems: high Diagnostic procedures: high Management options: high  Critical Care Total time providing critical care: 30-74 minutes (35)  Patient Progress Patient progress: stable  Final Clinical Impression(s) / ED Diagnoses Final diagnoses:  Basal ganglia hemorrhage (HCC)      Gerhard Munch, MD 03/05/20 1916

## 2020-03-05 NOTE — Consult Note (Signed)
   Providing Compassionate, Quality Care - Together  Neurosurgery Consult  Referring physician: Dr. Jeraldine Loots Reason for referral: IPH  Chief Complaint: AMS  History of Present Illness: This is a 59 yo M, R handed, with a history of L crani x3 for SDH and EDH (2013x2, 2016), recent CVA on plavix that presented via EMS with acute R sided hemiparesis and AMS. Was found to be less responsive and noted to be hypertensive in the field at 290. Was brought to ED emergently as code stroke.  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 CTA neg for aneurysm.  Brother is at bedside and able to provide some detailed history. He states recently he hasnt been doing well and has had many dizzy episodes and has had trouble ambulating. He does live alone and perform his ADLs.   Medications: I have reviewed the patient's current medications. Allergies: No Known Allergies  History reviewed. No pertinent family history. Social History:  has no history on file for tobacco use, alcohol use, and drug use.  ROS: Unable to obtain  Physical Exam:  Vital signs in last 24 hours: Temp:  [98 F (36.7 C)-98.3 F (36.8 C)] 98 F (36.7 C) (07/25 1814) Pulse Rate:  [58-128] 65 (07/26 0746) Resp:  [11-18] 14 (07/26 0217) BP: (138-182)/(65-125) 153/88 (07/26 0700) SpO2:  [91 %-98 %] 96 % (07/26 0746) PE: Eyes closed, do not open to pain PERRL, leftward gaze deviation R facial droop No motor response RU/LE Localization in LUE WD to pain in LLE No verbal response   Impression/Assessment:  59 yo M with:  1. Large left IPH with IVH, midline shift -ICH volume >70cc, IVH -ICH score 3   Plan:  -due to significant involvement in L hemisphere, I do not recommend surgical intervention or ventriculostomy  -brother noted that his brother would not want to live requiring help and daily care  -ED giving PLT and DDAVP  due to plavix -SBP control -admit per neurology -I spent a significant amount of time (>48mins) discussing the findings and outcomes of such hemorrhage with the brothers and sisters. I showed the images of the CT to the brother at bedside. They agree with no surgical intervention. -All questions answered -will signoff at this time  Thank you for allowing me to participate in this patient's care.  Please do not hesitate to call with questions or concerns.   Monia Pouch, DO Neurosurgeon [facility] Cell: [phone]

## 2020-03-05 NOTE — ED Notes (Signed)
RN aware of increasing BP.

## 2020-03-05 NOTE — H&P (Signed)
History and Physical  Gerald Morgan ZJQ:[id number] DOB: February 18, 1961 DOA: 03/05/2020  Referring physician:   PCP: [name], NP  Outpatient Specialists: Cardiology, neurosurgery Patient coming from: Home.  Chief Complaint: Code stroke from home.  HPI: Gerald Morgan is a 59 y.o. male with medical history significant for tobacco and alcohol use, essential hypertension, hyperlipidemia, previous intracranial hemorrhage, recent CVA on Plavix who presented to [facility] ED via EMS as a code stroke from home. History is obtained from review of medical records and from EDP.  Reports of dizziness and trouble ambulating with acute right-sided hemiparesis and altered mental status.  Upon EMS arrival, was found to be less responsive and noted to be severely hypertensive with systolic in the 290s.  Upon arrival to the ED CT head stroke protocol revealed: 1. Large hemorrhage in the left basal ganglia with estimated volume of greater than 70 mL. 2. Extensive mass effect from the hemorrhage in surrounding edema resulting in 12 mm midline shift. 3. Intraventricular extension of the hemorrhage with blood in both lateral ventricles and the third ventricle. 4. Background of diffuse white matter disease again noted. Patient was seen by neurology and neurosurgery.  Family made decision for comfort care on 03/05/2020.  At the time of this visit, he is lethargic and minimally responsive.    ED Course: BP 268/151, HR 58.  WBC 13.0K, neutrophil count 11.4.  Serum bicarb 21, anion gap 13, serum glucose 160, creatinine 1.68.  Review of Systems: Review of systems as noted in the HPI. All other systems reviewed and are negative.   Past Medical History:  Diagnosis Date  . Hypertension    Past Surgical History:  Procedure Laterality Date  . BRAIN SURGERY    . CRANIOTOMY  09/19/2011   Procedure: CRANIOTOMY HEMATOMA EVACUATION EPIDURAL;  Surgeon: Barnett Abu, MD;  Location: [facility];   Service: Neurosurgery;  Laterality: Left;  Redo craniotomy for evacuation of epidural hematoma  . CRANIOTOMY Left 04/17/2014   Procedure: CRANIOTOMY HEMATOMA EVACUATION SUBDURAL;  Surgeon: Tressie Stalker, MD;  Location: [facility] NEUR[facility] ORS;  Service: Neurosurgery;  Laterality: Left;  left    Social History:  reports that he has been smoking cigarettes. He has been smoking about 0.50 packs per day. He has never used smokeless tobacco. He reports current alcohol use of about 2.0 standard drinks of alcohol per week. He reports current drug use. Drug: Marijuana.   No Known Allergies  Family history: Per medical records: Brother with history of MI.   Prior to Admission medications   Medication Sig Start Date End Date Taking? Authorizing Provider  amLODipine (NORVASC) 10 MG tablet Take 1 tablet (10 mg total) by mouth daily. 01/20/20   [name], NP  atorvastatin (LIPITOR) 40 MG tablet Take 1 tablet (40 mg total) by mouth daily. Patient not taking: Reported on 01/20/2020 12/14/19   Marguerita Merles Latif, DO  buPROPion [facility] SR) 150 MG 12 hr tablet Take 1 tablet (150 mg total) by mouth 2 (two) times daily. 02/03/20   [name], NP  clopidogrel (PLAVIX) 75 MG tablet Take 1 tablet (75 mg total) by mouth daily. 01/21/20   [name], NP  labetalol (NORMODYNE) 300 MG tablet Take 1 tablet (300 mg total) by mouth 2 (two) times daily. 02/03/20   [name], NP  losartan (COZAAR) 100 MG tablet Take 1 tablet (100 mg total) by mouth daily. 01/20/20   [name], NP    Physical Exam:  BP (!) 140/94   Pulse 92   Temp 98.5 F (36.9 C) (Axillary)   Resp (!) 25   Ht 6' (1.829 m)   Wt 63.7 kg   SpO2 96%   BMI 19.05 kg/m   . General: 59 y.o. year-old male chronically ill-appearing, lethargic and minimally responsive.  . Cardiovascular: Regular rate and rhythm with no rubs or gallops.  No thyromegaly or JVD noted.  No lower extremity edema. 2/4 pulses in all 4  extremities. Marland Kitchen Respiratory: Clear to auscultation with no wheezes or rales.  Poor inspiratory effort. . Abdomen: Soft nontender nondistended with normal bowel sounds x4 quadrants. . Muskuloskeletal: No cyanosis, clubbing or edema noted bilaterally . Neuro: Unable to assess due to minimal responsiveness. . Skin: No ulcerative lesions noted or rashes . Psychiatry: Judgement and insight unable to assess due to minimal responsiveness.         Labs on Admission:  Basic Metabolic Panel: Recent Labs  Lab 03/05/20 1700 03/05/20 1701  NA 141 140  K 4.3 4.3  CL 107 106  CO2  --  21*  GLUCOSE 159* 160*  BUN 17 13  CREATININE 1.50* 1.68*  CALCIUM  --  9.5   Liver Function Tests: Recent Labs  Lab 03/05/20 1701  AST 31  ALT 27  ALKPHOS 103  BILITOT 1.2  PROT 8.0  ALBUMIN 4.3   No results for input(s): LIPASE, AMYLASE in the last 168 hours. No results for input(s): AMMONIA in the last 168 hours. CBC: Recent Labs  Lab 03/05/20 1700 03/05/20 1701  WBC  --  13.0*  NEUTROABS  --  11.4*  HGB 17.3* 15.9  HCT 51.0 48.3  MCV  --  91.5  PLT  --  338   Cardiac Enzymes: No results for input(s): CKTOTAL, CKMB, CKMBINDEX, TROPONINI in the last 168 hours.  BNP (last 3 results) No results for input(s): BNP in the last 8760 hours.  ProBNP (last 3 results) No results for input(s): PROBNP in the last 8760 hours.  CBG: Recent Labs  Lab 03/05/20 1654  GLUCAP 146*    Radiological Exams on Admission: CT Code Stroke CTA Head W/WO contrast  Result Date: 03/05/2020 CLINICAL DATA:  Intracranial hemorrhage EXAM: CT ANGIOGRAPHY HEAD TECHNIQUE: Multidetector CT imaging of the head was performed using the standard protocol during bolus administration of intravenous contrast. Multiplanar CT image reconstructions and MIPs were obtained to evaluate the vascular anatomy. CONTRAST:  6mL OMNIPAQUE IOHEXOL 350 MG/ML SOLN COMPARISON:  12/12/2019 FINDINGS: CTA HEAD Anterior circulation: Intracranial  internal carotid arteries are patent with mild calcified plaque. Anterior and middle cerebral arteries are patent. There is multifocal irregularity with greatest involvement of left MCA and bilateral distal ACA branches with areas of stenosis ranging from mild to marked. There is no abnormal enhancement or vascularity in the region of parenchymal hemorrhage. Posterior circulation: Intracranial vertebral arteries, basilar artery, and posterior cerebral arteries are patent. There is high-grade stenosis near the left P1/P2 PCA junction, which was present on prior MRA. Irregularity of the right P2 and P3 PCA. Venous sinuses: Not well evaluated. IMPRESSION: No abnormal enhancement or vascularity in the region of parenchymal hemorrhage. Multifocal vessel irregularity primarily of medium and small vessels. Left P1-P2 PCA junction high-grade stenosis was present on 12/12/2019 MRA. Additional areas of irregularity and stenosis in anterior and posterior circulations may be progressed since the MRA raising the possibility of a non-atherosclerotic vasculopathy. Electronically Signed   By: Guadlupe Spanish M.D.   On: 03/05/2020 17:31  CT HEAD CODE STROKE WO CONTRAST  Result Date: 03/05/2020 CLINICAL DATA:  Code stroke.  Right-sided paralysis. EXAM: CT HEAD WITHOUT CONTRAST TECHNIQUE: Contiguous axial images were obtained from the base of the skull through the vertex without intravenous contrast. COMPARISON:  CT head without contrast 12/08/2019 FINDINGS: Brain: Large left basal ganglia hemorrhage measures 5.5 x 2.8 x 9.5 cm. Estimated volume is greater than the 70 mL. Intraventricular extension is present. Extensive edema and mass effect is present with midline shift up to 12 mm. Blood is seen in both lateral ventricles and in the third ventricle. Diffuse white matter hypoattenuation is again seen otherwise. Asymmetric hypoattenuation in the left hemisphere represents edema. No extra-axial hemorrhage is present. Vascular:  Atherosclerotic calcifications are present in the cavernous internal carotid arteries bilaterally. No hyperdense vessel is present. Skull: Left craniotomy noted.  Calvarium otherwise intact. Sinuses/Orbits: Extensive paranasal mucosal thickening is present. The right sphenoid sinus is near completely opacified, chronic finding. Fluid level is present in the left maxillary sinus. Nasal intubation is noted on the left. The globes and orbits are within normal limits. IMPRESSION: 1. Large hemorrhage in the left basal ganglia with estimated volume of greater than 70 mL. 2. Extensive mass effect from the hemorrhage in surrounding edema resulting in 12 mm midline shift. 3. Intraventricular extension of the hemorrhage with blood in both lateral ventricles and the third ventricle. 4. Background of diffuse white matter disease again noted. Critical Value/emergent results were called by telephone at the time of interpretation on 03/05/2020 at 5:17 pm to provider Gerhard Munch , who verbally acknowledged these results. Electronically Signed   By: Marin Roberts M.D.   On: 03/05/2020 17:20    EKG: I independently viewed the EKG done and my findings are as followed: None available at the time of this visit.  Assessment/Plan Present on Admission: **None**  Active Problems:   Intracranial hemorrhage (HCC)  Large left basal ganglia hemorrhage with significant midline shift 11 mm and early hydrocephalus CTA negative for aneurysm. Seen by neurology and neurosurgery Family has made decision for comfort care only 03/05/2020. Comfort care orders in place.  History of previous intracranial hemorrhage, recent CVA on Plavix Essential hypertension  Comfort care measures All efforts directed at patient's comfort. Palliative care and TOC consulted to assist with hospice placement.   DVT prophylaxis: None.  Comfort care.  Code Status: DNR/comfort care.  Family Communication: None at bedside.  Disposition  Plan: Admit to MedSurg unit.  Consults called: Neurology, neurosurgery consulted by EDP.  Admission status: Observation status.   Status is: Observation   Dispo:  Patient From: Home  Planned Disposition: Home  Expected discharge date: 03/06/20  Medically stable for discharge: No, ongoing management of comfort care.   Darlin Drop MD [facility] Pager [phone]  If 7PM-7AM, please contact night-coverage [email or web address] Password [facility]  03/05/2020, 7:29 PM

## 2020-03-06 DIAGNOSIS — Z515 Encounter for palliative care: Secondary | ICD-10-CM

## 2020-03-06 DIAGNOSIS — I619 Nontraumatic intracerebral hemorrhage, unspecified: Secondary | ICD-10-CM | POA: Insufficient documentation

## 2020-03-06 DIAGNOSIS — Z7189 Other specified counseling: Secondary | ICD-10-CM

## 2020-03-06 LAB — PREPARE PLATELET PHERESIS
Unit division: 0
Unit division: 0
Unit division: 0
Unit division: 0

## 2020-03-06 LAB — BPAM PLATELET PHERESIS
Blood Product Expiration Date: 202111212359
Blood Product Expiration Date: 202111212359
Blood Product Expiration Date: 202111212359
Blood Product Expiration Date: 202111222359
ISSUE DATE / TIME: 202111191743
ISSUE DATE / TIME: 202111191743
ISSUE DATE / TIME: 202111200105
ISSUE DATE / TIME: 202111200334
Unit Type and Rh: 6200
Unit Type and Rh: 7300
Unit Type and Rh: 7300
Unit Type and Rh: 8400

## 2020-03-06 MED ORDER — ONDANSETRON HCL 4 MG/2ML IJ SOLN: 4.0000 mg | Freq: Four times a day (QID) | INTRAMUSCULAR | Status: DC | PRN

## 2020-03-06 MED ORDER — MORPHINE SULFATE (CONCENTRATE) 10 MG/0.5ML PO SOLN: 5.0000 mg | ORAL | Status: AC | PRN

## 2020-03-06 MED ORDER — POLYVINYL ALCOHOL 1.4 % OP SOLN: 1.0000 [drp] | OPHTHALMIC | Status: DC | PRN

## 2020-03-06 MED ORDER — LABETALOL HCL 5 MG/ML IV SOLN: 20.0000 mg | INTRAVENOUS | Status: DC | PRN

## 2020-03-06 MED ORDER — LORAZEPAM 2 MG/ML IJ SOLN
0.5000 mg | INTRAMUSCULAR | 0 refills | Status: AC | PRN
Start: 2020-03-06 — End: ?

## 2020-03-06 MED ORDER — MORPHINE SULFATE (PF) 2 MG/ML IV SOLN
1.0000 mg | INTRAVENOUS | 0 refills | Status: AC | PRN
Start: 2020-03-06 — End: ?

## 2020-03-06 MED ORDER — HYDRALAZINE HCL 20 MG/ML IJ SOLN
20.0000 mg | INTRAMUSCULAR | Status: DC | PRN
  Administered 2020-03-06: 20 mg via INTRAVENOUS
  Filled 2020-03-06: qty 1

## 2020-03-06 MED ORDER — ONDANSETRON HCL 4 MG/2ML IJ SOLN: 4.0000 mg | Freq: Four times a day (QID) | INTRAMUSCULAR | 0 refills | Status: AC | PRN

## 2020-03-06 MED ORDER — MORPHINE SULFATE (PF) 2 MG/ML IV SOLN: 1.0000 mg | INTRAVENOUS | Status: DC | PRN

## 2020-03-06 MED ORDER — LABETALOL HCL 5 MG/ML IV SOLN
20.0000 mg | INTRAVENOUS | Status: DC | PRN
  Administered 2020-03-06: 20 mg via INTRAVENOUS
  Filled 2020-03-06: qty 4

## 2020-03-06 MED ORDER — GLYCOPYRROLATE 0.2 MG/ML IJ SOLN
0.4000 mg | INTRAMUSCULAR | Status: DC
  Administered 2020-03-06 (×2): 0.4 mg via INTRAVENOUS
  Filled 2020-03-06: qty 2

## 2020-03-06 MED ORDER — POLYVINYL ALCOHOL 1.4 % OP SOLN: 1.0000 [drp] | OPHTHALMIC | 0 refills | Status: AC | PRN

## 2020-03-06 MED ORDER — GLYCOPYRROLATE 0.2 MG/ML IJ SOLN: 0.4000 mg | INTRAMUSCULAR | Status: AC

## 2020-03-06 MED ORDER — BIOTENE DRY MOUTH MT LIQD
15.0000 mL | Freq: Two times a day (BID) | OROMUCOSAL | Status: DC
  Administered 2020-03-06: 15 mL via OROMUCOSAL

## 2020-03-06 MED ORDER — BISACODYL 10 MG RE SUPP: 10.0000 mg | Freq: Every day | RECTAL | Status: DC | PRN

## 2020-03-06 MED ORDER — LORAZEPAM 2 MG/ML IJ SOLN: 0.5000 mg | INTRAMUSCULAR | Status: DC | PRN

## 2020-03-06 MED ORDER — HYDRALAZINE HCL 20 MG/ML IJ SOLN
20.0000 mg | Freq: Once | INTRAMUSCULAR | Status: AC
  Administered 2020-03-06: 20 mg via INTRAVENOUS
  Filled 2020-03-06: qty 1

## 2020-03-06 MED ORDER — LABETALOL HCL 5 MG/ML IV SOLN
20.0000 mg | INTRAVENOUS | Status: DC | PRN
  Administered 2020-03-06 (×2): 20 mg via INTRAVENOUS
  Filled 2020-03-06 (×2): qty 4

## 2020-03-06 NOTE — Progress Notes (Signed)
Civil engineer, contracting [facility]) Hospital Liaison note.    Received request from [facility] manager for family interest in [facility]. Chart reviewed and eligibility confirmed. Spoke with family to confirm interest and explain services. Family agreeable to transfer today. TOC aware.    [facility] will notify TOC when registration paperwork has been completed to arrange transport.   RN please call report to [phone]. Please be sure the signed DNR form transports with the patient.  Thank you for the opportunity to participate in this patient's care.  Chrislyn Brooke Dare, BSN, RN [facility] Liaison (listed on AMION under Hospice[facility])    [phone]

## 2020-03-06 NOTE — Consult Note (Addendum)
Palliative Medicine Inpatient Consult Note  Reason for consult:  Comfort Focused Care  HPI:  Per intake H&P --> Gerald Morgan is a 59 y.o. male with medical history significant for tobacco and alcohol use, essential hypertension, hyperlipidemia, previous intracranial hemorrhage, recent CVA on Plavix who presented to [facility] ED via EMS as a code stroke from home. History is obtained from review of medical records and from EDP.  Reports of dizziness and trouble ambulating with acute right-sided hemiparesis and altered mental status.  Upon EMS arrival, was found to be less responsive and noted to be severely hypertensive with systolic in the 290s. Identified to have a large hemorrhagic stroke.   Palliative care was asked to get involved to discuss goals of care in the setting of a massive hemorrhagic stroke.  Clinical Assessment/Goals of Care: I have reviewed medical records including EPIC notes, labs and imaging, received report from bedside RN, assessed the patient who is somnolent and not responsive.    I called patient's brother Gerald Morgan to further discuss diagnosis prognosis, GOC, EOL wishes, disposition and options.   I introduced Palliative Medicine as specialized medical care for people living with serious illness. It focuses on providing relief from the symptoms and stress of a serious illness. The goal is to improve quality of life for both the patient and the family.  I asked Gerald Morgan to tell me a little bit about his brother.  He shares that he lives in a friend's basement in [place], [place].  He is not presently working.  He has never been married and does not have any children.  He unfortunately is an alcoholic Gerald Morgan shares he likely does other drugs as well though he does not know which ones.  He states that his brother has been on "the wrong track" for a long time now.  He goes on to share that sadly, he is not surprised that his brother has ended up in this  state. Patient does have a degree of faith.   From a functional perspective Gerald Morgan as far as his family knows was able to complete basic activities of daily living before this event.  A detailed discussion was had today regarding advanced directives patient had never completed these and at this point time would not be able to.  Patient's 2 brothers and sisters are the decision makers at this juncture.    Concepts specific to code status, artifical feeding and hydration, continued IV antibiotics and rehospitalization was had.  Patient is DO NOT RESUSCITATE DO NOT INTUBATE CODE STATUS. We talked about transition to comfort measures in house and what that would entail inclusive of medications to control pain, dyspnea, agitation, nausea, itching, and hiccups.   We discussed stopping all uneccessary measures such as blood draws, needle sticks, and frequent vital signs.   Gerald Morgan and his family have elected to transition focus to comfort oriented care as they realize a meaningful recovery will not take place in the situation.  They would like the patient to pass with comfort and dignity.  Gerald Morgan expresses that this is very difficult because his father has just lost a sister.  The services for this are tomorrow.  Gerald Morgan plans to bring his father later today though expresses not knowing how to "break the news".  Offered emotional support through therapeutic listening.  We discussed that Gerald Morgan may have a few days to live and that it may be reasonable to consider transition to a hospice home to continue the current  level of care and allow for liberalization of visitation.  Gerald Morgan states that they are in [place] and [facility] would be the best location for him to move to.  I was able to reach out to social work and the [employer] regarding this.  Discussed the importance of continued conversation with family and their  medical providers regarding overall plan of care and treatment options, ensuring  decisions are within the context of the patients values and GOCs.  Decision Maker:  SUMMARY OF RECOMMENDATIONS   DNAR/DNI  Comfort Focused Care  [facility]  Spiritual Support  Code Status/Advance Care Planning: DNAR/DNI   Symptom Management:  Dyspnea: Pain:  - Morphine 5mg  PO/SL Q2H PRN  - Morphine 2mg  IV Q1H PRN Fever:  - Tylenol 650mg  PO/PR Q6H PRN Agitation: Anxiety:  - Lorazepam 1mg  IV  Q1H PRN Nausea:  - Zofran 4mg  PO/IV Q6H PRN  Secretions:  - Glycopyrrolate 0.4mg  IV Q4H ATC Dry Eyes:  - Artificial Tears PRN Xerostomia:  - Biotene 11ml PRN  - BID oral care Urinary Retention:  - Bladder scan if > place and maintain foley  Constipation:  - Bisacodyl 10mg  PR PRN QDay Spiritual:  - Chaplain consult   Palliative Prophylaxis:   Oral Care, Turn Q2H  Additional Recommendations (Limitations, Scope, Preferences):  Comfort focused care   Psycho-social/Spiritual:   Desire for further Chaplaincy support: Yes  Additional Recommendations: Education on end of life   Prognosis: Anticipate days in the setting of a large hemorrhagic stroke  Discharge Planning: Discharge to [facility] when a bed is available  Vitals:   03/06/20 0558 03/06/20 0610  BP: (!) 180/120 (!) 158/98  Pulse: 85 83  Resp: (!) 22 20  Temp: 98.4 F (36.9 C) 98.6 F (37 C)  SpO2:  99%    Intake/Output Summary (Last 24 hours) at 03/06/2020 0656 Last data filed at 03/06/2020 0600 Gross per 24 hour  Intake 409.9 ml  Output 630 ml  Net -220.1 ml   Last Weight  Most recent update: 03/05/2020  5:32 PM   Weight  63.7 kg (140 lb 6.9 oz)           Gen:  AA M - unresponsive HEENT: dry mucous membranes, nasal trumpet in place CV: Regular rate and rhythm  PULM: clear to auscultation bilaterally  ABD: soft/nontender  EXT: No edema  Neuro: Somnolent; unresponsive to voice  PPS: 10%   This conversation/these recommendations were discussed with patient primary care  team, Dr. [facility]  Time In: 0840 Time Out: 0950 Total Time: 70 Greater than 50%  of this time was spent counseling and coordinating care related to the above assessment and plan.  03/02/2020 [facility] Palliative Medicine Team Team Cell Phone: [phone] Please utilize secure chat with additional questions, if there is no response within 30 minutes please call the above phone number  Palliative Medicine Team providers are available by phone from 7am to 7pm daily and can be reached through the team cell phone.  Should this patient require assistance outside of these hours, please call the patient's attending physician.

## 2020-03-06 NOTE — TOC Transition Note (Addendum)
Transition of Care [facility]) - CM/SW Discharge Note   Patient Details  Name: Gerald SHINAULT MRN: [id number] Date of Birth: 08-Feb-1961  Transition of Care [facility]) CM/SW Contact:  Carley Hammed, LCSWA Phone Number: 03/06/2020, 11:01 AM   Clinical Narrative:    Pt to be transferred to [facility], SW will follow for transportation and then sign off.  Nurse to call report to [phone]   Final next level of care: Hospice Medical Facility Barriers to Discharge: Barriers Resolved   Patient Goals and CMS Choice        Discharge Placement              Patient chooses bed at:  [facility]) Patient to be transferred to facility by: [facility] Name of family member notified: Rudolf, [place] [phone] Patient and family notified of of transfer: 03/06/20  Discharge Plan and Services                                     Social Determinants of Health (SDOH) Interventions     Readmission Risk Interventions No flowsheet data found.

## 2020-03-06 NOTE — Discharge Summary (Signed)
Physician Discharge Summary  Barnett Hatterverian B Esses ZDG:[id number] DOB: 03/20/1961 DOA: 03/05/2020  PCP: Grayce SessionsEdwards, Michelle P, NP  Admit date: 03/05/2020 Discharge date: 03/06/2020  Admitted From: Home Discharge disposition: [facility]   Code Status: DNR  Diet Recommendation: Unable to eat  Discharge Diagnosis:   Active Problems:   Intracranial hemorrhage [facility](HCC)   Intracerebral hemorrhage   Palliative care by specialist   End of life care   Goals of care, counseling/discussion   Terminal care   History of Present Illness / Brief narrative:  Gerald B Goinsis a 59 y.o.malewith PMH significant fortobacco and alcohol use, essential hypertension, hyperlipidemia, 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 Patient was brought to ED from home by EMS on 11/19 as a code stroke for acute onset right-sided weakness and altered mental status. EMS noted his blood pressure to be severely elevated to 290.  Oxygen saturation 94%.  On arrival to the ED, blood pressure was 268/151, patient was densely hemiplegic on the right and was nonverbal. Stat CT head showed a massive acute left hemispheric bleed with the anteroinferior tip near the MCA.  There was intraventricular extension of blood and mass-effect with initial stasis with midline shift and enlarged ventricles. CTA head and neck negative for aneurysm. Patient was seen by neurology and neurosurgery. After discussion with consultants, family made a decision of comfort care.  Subjective:  Seen and examined this morning.  Comfortable.  Hospital Course:  59 year old male with acute left hemispheric ICH with mass effect, left to right midline shift and intraventricular extension -Currently on comfort care status per family's decision -As needed IV Ativan, IV morphine available. -DNR status. -Okay to discharge to [facility] today.  Wound care: Incision (Closed)  04/17/14 Head Left (Active)  Date First Assessed/Time First Assessed: 04/17/14 2045   Location: Head  Location Orientation: Left    Assessments 04/17/2014  9:15 PM 04/22/2014  8:02 AM  Dressing Type Gauze (Comment);Tape dressing None  Dressing Clean;Dry;Intact Dry;Intact  Margins -- Attached edges (approximated)  Closure -- Staples  Drainage Amount -- None     No Linked orders to display    Discharge Exam:   Vitals:   03/06/20 0440 03/06/20 0558 03/06/20 0610 03/06/20 0739  BP: (!) 169/113 (!) 180/120 (!) 158/98 (!) 196/121  Pulse:  85 83 89  Resp: (!) 21 (!) 22 20   Temp: 98.1 F (36.7 C) 98.4 F (36.9 C) 98.6 F (37 C) 98.2 F (36.8 C)  TempSrc: Axillary Axillary Axillary Oral  SpO2:   99% 100%  Weight:      Height:        Body mass index is 19.05 kg/m.  General exam: Comfortable Did not do detailed exam because of comfort care status Follow ups:     Follow-up Information    Grayce SessionsEdwards, Michelle P, NP Follow up.   Specialty: Internal Medicine Contact information: [address] [place] [address] [phone]               Recommendations for Outpatient Follow-Up:   1. Per hospice policy  Discharge Instructions:  Follow with Primary MD Grayce SessionsEdwards, Michelle P, NP in 7 days.  Please note You were cared for by a hospitalist during your hospital stay. If you have any questions about your discharge medications or the care you received while you were in the hospital after you are discharged, you can call the unit and asked to speak with the  hospitalist on call if the hospitalist that took care of you is not available. Once you are discharged, your primary care physician will handle any further medical issues. Please note that NO REFILLS for any discharge medications will be authorized once you are discharged, as it is imperative that you return to your primary care physician (or establish a relationship with a primary care physician if you do not have one) for  your aftercare needs so that they can reassess your need for medications and monitor your lab values.   Allergies as of 03/06/2020   No Known Allergies     Medication List    STOP taking these medications   amLODipine 10 MG tablet Commonly known as: NORVASC   atorvastatin 40 MG tablet Commonly known as: LIPITOR   buPROPion 150 MG 12 hr tablet Commonly known as: Wellbutrin SR   clopidogrel 75 MG tablet Commonly known as: PLAVIX   labetalol 300 MG tablet Commonly known as: NORMODYNE   losartan 100 MG tablet Commonly known as: COZAAR     TAKE these medications   glycopyrrolate 0.2 MG/ML injection Commonly known as: ROBINUL Inject 2 mLs (0.4 mg total) into the vein every 4 (four) hours.   LORazepam 2 MG/ML injection Commonly known as: ATIVAN Inject 0.25-0.5 mLs (0.5-1 mg total) into the vein every hour as needed for anxiety, seizure or sedation.   morphine 2 MG/ML injection Inject 0.5-1 mLs (1-2 mg total) into the vein every hour as needed (or dyspnea).   morphine CONCENTRATE 10 MG/0.5ML Soln concentrated solution Take 0.25 mLs (5 mg total) by mouth every 2 (two) hours as needed for moderate pain (or dyspnea).   morphine CONCENTRATE 10 MG/0.5ML Soln concentrated solution Place 0.25 mLs (5 mg total) under the tongue every 2 (two) hours as needed for moderate pain (or dyspnea).   ondansetron 4 MG/2ML Soln injection Commonly known as: ZOFRAN Inject 2 mLs (4 mg total) into the vein every 6 (six) hours as needed for nausea.   polyvinyl alcohol 1.4 % ophthalmic solution Commonly known as: LIQUIFILM TEARS Place 1 drop into both eyes as needed for dry eyes.       Time coordinating discharge: 25 minutes  The results of significant diagnostics from this hospitalization (including imaging, microbiology, ancillary and laboratory) are listed below for reference.    Procedures and Diagnostic Studies:   CT Code Stroke CTA Head W/WO contrast  Result Date:  03/05/2020 CLINICAL DATA:  Intracranial hemorrhage EXAM: CT ANGIOGRAPHY HEAD TECHNIQUE: Multidetector CT imaging of the head was performed using the standard protocol during bolus administration of intravenous contrast. Multiplanar CT image reconstructions and MIPs were obtained to evaluate the vascular anatomy. CONTRAST:  25mL OMNIPAQUE IOHEXOL 350 MG/ML SOLN COMPARISON:  12/12/2019 FINDINGS: CTA HEAD Anterior circulation: Intracranial internal carotid arteries are patent with mild calcified plaque. Anterior and middle cerebral arteries are patent. There is multifocal irregularity with greatest involvement of left MCA and bilateral distal ACA branches with areas of stenosis ranging from mild to marked. There is no abnormal enhancement or vascularity in the region of parenchymal hemorrhage. Posterior circulation: Intracranial vertebral arteries, basilar artery, and posterior cerebral arteries are patent. There is high-grade stenosis near the left P1/P2 PCA junction, which was present on prior MRA. Irregularity of the right P2 and P3 PCA. Venous sinuses: Not well evaluated. IMPRESSION: No abnormal enhancement or vascularity in the region of parenchymal hemorrhage. Multifocal vessel irregularity primarily of medium and small vessels. Left P1-P2 PCA junction high-grade stenosis was present on  12/12/2019 MRA. Additional areas of irregularity and stenosis in anterior and posterior circulations may be progressed since the MRA raising the possibility of a non-atherosclerotic vasculopathy. Electronically Signed   By: Guadlupe Spanish M.D.   On: 03/05/2020 17:31   CT HEAD CODE STROKE WO CONTRAST  Result Date: 03/05/2020 CLINICAL DATA:  Code stroke.  Right-sided paralysis. EXAM: CT HEAD WITHOUT CONTRAST TECHNIQUE: Contiguous axial images were obtained from the base of the skull through the vertex without intravenous contrast. COMPARISON:  CT head without contrast 12/08/2019 FINDINGS: Brain: Large left basal ganglia  hemorrhage measures 5.5 x 2.8 x 9.5 cm. Estimated volume is greater than the 70 mL. Intraventricular extension is present. Extensive edema and mass effect is present with midline shift up to 12 mm. Blood is seen in both lateral ventricles and in the third ventricle. Diffuse white matter hypoattenuation is again seen otherwise. Asymmetric hypoattenuation in the left hemisphere represents edema. No extra-axial hemorrhage is present. Vascular: Atherosclerotic calcifications are present in the cavernous internal carotid arteries bilaterally. No hyperdense vessel is present. Skull: Left craniotomy noted.  Calvarium otherwise intact. Sinuses/Orbits: Extensive paranasal mucosal thickening is present. The right sphenoid sinus is near completely opacified, chronic finding. Fluid level is present in the left maxillary sinus. Nasal intubation is noted on the left. The globes and orbits are within normal limits. IMPRESSION: 1. Large hemorrhage in the left basal ganglia with estimated volume of greater than 70 mL. 2. Extensive mass effect from the hemorrhage in surrounding edema resulting in 12 mm midline shift. 3. Intraventricular extension of the hemorrhage with blood in both lateral ventricles and the third ventricle. 4. Background of diffuse white matter disease again noted. Critical Value/emergent results were called by telephone at the time of interpretation on 03/05/2020 at 5:17 pm to provider Gerhard Munch , who verbally acknowledged these results. Electronically Signed   By: Marin Roberts M.D.   On: 03/05/2020 17:20     Labs:   Basic Metabolic Panel: Recent Labs  Lab 03/05/20 1700 03/05/20 1701  NA 141 140  K 4.3 4.3  CL 107 106  CO2  --  21*  GLUCOSE 159* 160*  BUN 17 13  CREATININE 1.50* 1.68*  CALCIUM  --  9.5   GFR Estimated Creatinine Clearance: 43.7 mL/min (A) (by C-G formula based on SCr of 1.68 mg/dL (H)). Liver Function Tests: Recent Labs  Lab 03/05/20 1701  AST 31  ALT 27   ALKPHOS 103  BILITOT 1.2  PROT 8.0  ALBUMIN 4.3   No results for input(s): LIPASE, AMYLASE in the last 168 hours. No results for input(s): AMMONIA in the last 168 hours. Coagulation profile Recent Labs  Lab 03/05/20 1701  INR 1.0    CBC: Recent Labs  Lab 03/05/20 1700 03/05/20 1701  WBC  --  13.0*  NEUTROABS  --  11.4*  HGB 17.3* 15.9  HCT 51.0 48.3  MCV  --  91.5  PLT  --  338   Cardiac Enzymes: No results for input(s): CKTOTAL, CKMB, CKMBINDEX, TROPONINI in the last 168 hours. BNP: Invalid input(s): POCBNP CBG: Recent Labs  Lab 03/05/20 1654  GLUCAP 146*   D-Dimer No results for input(s): DDIMER in the last 72 hours. Hgb A1c No results for input(s): HGBA1C in the last 72 hours. Lipid Profile No results for input(s): CHOL, HDL, LDLCALC, TRIG, CHOLHDL, LDLDIRECT in the last 72 hours. Thyroid function studies No results for input(s): TSH, T4TOTAL, T3FREE, THYROIDAB in the last 72 hours.  Invalid input(s):  FREET3 Anemia work up No results for input(s): VITAMINB12, FOLATE, FERRITIN, TIBC, IRON, RETICCTPCT in the last 72 hours. Microbiology Recent Results (from the past 240 hour(s))  Respiratory Panel by RT PCR (Flu A&B, Covid) - Nasopharyngeal Swab     Status: None   Collection Time: 03/05/20  7:33 PM   Specimen: Nasopharyngeal Swab; Nasopharyngeal(NP) swabs in vial transport medium  Result Value Ref Range Status   SARS Coronavirus 2 by RT PCR NEGATIVE NEGATIVE Final    Comment: (NOTE) SARS-CoV-2 target nucleic acids are NOT DETECTED.  The SARS-CoV-2 RNA is generally detectable in upper respiratoy specimens during the acute phase of infection. The lowest concentration of SARS-CoV-2 viral copies this assay can detect is 131 copies/mL. A negative result does not preclude SARS-Cov-2 infection and should not be used as the sole basis for treatment or other patient management decisions. A negative result may occur with  improper specimen collection/handling,  submission of specimen other than nasopharyngeal swab, presence of viral mutation(s) within the areas targeted by this assay, and inadequate number of viral copies (<131 copies/mL). A negative result must be combined with clinical observations, patient history, and epidemiological information. The expected result is Negative.  Fact Sheet for Patients:  https://www.moore.com/  Fact Sheet for Healthcare Providers:  https://www.young.biz/  This test is no t yet approved or cleared by the [place] FDA and  has been authorized for detection and/or diagnosis of SARS-CoV-2 by FDA under an Emergency Use Authorization (EUA). This EUA will remain  in effect (meaning this test can be used) for the duration of the COVID-19 declaration under Section 564(b)(1) of the Act, 21 U.S.C. section 360bbb-3(b)(1), unless the authorization is terminated or revoked sooner.     Influenza A by PCR NEGATIVE NEGATIVE Final   Influenza B by PCR NEGATIVE NEGATIVE Final    Comment: (NOTE) The Xpert Xpress SARS-CoV-2/FLU/RSV assay is intended as an aid in  the diagnosis of influenza from Nasopharyngeal swab specimens and  should not be used as a sole basis for treatment. Nasal washings and  aspirates are unacceptable for Xpert Xpress SARS-CoV-2/FLU/RSV  testing.  Fact Sheet for Patients: https://www.moore.com/  Fact Sheet for Healthcare Providers: https://www.young.biz/  This test is not yet approved or cleared by the [place] FDA and  has been authorized for detection and/or diagnosis of SARS-CoV-2 by  FDA under an Emergency Use Authorization (EUA). This EUA will remain  in effect (meaning this test can be used) for the duration of the  Covid-19 declaration under Section 564(b)(1) of the Act, 21  U.S.C. section 360bbb-3(b)(1), unless the authorization is  terminated or revoked. Performed at [facility], [address] [place], [place] [address]      Signed: Lorin Glass  [facility] 03/06/2020, 11:18 AM

## 2020-03-06 NOTE — Progress Notes (Signed)
Chaplain responded to page from patient's nurse to offer support to the family members gathered who are having a hard time dealing with patient's end of life.  Chaplain entered to find pt's father listening to recorded physician report from last night.  Chaplain returned to offer ministry of care and support especially for father who is grieving.  A family elder came in, granting Chaplain permission to stay as she prayed for pt's healing and strength for the family.  Chaplain spoke with pt's brother advising he could have me paged to come back at any time throughout the day if needed.  Vernell Morgans Chaplain Spiritual Care

## 2020-03-17 DEATH — deceased

## 2020-04-06 ENCOUNTER — Ambulatory Visit (INDEPENDENT_AMBULATORY_CARE_PROVIDER_SITE_OTHER): Payer: Self-pay | Admitting: Primary Care

## 2021-04-22 IMAGING — CT CT ANGIO HEAD
1 of 7 series · 7 of 47 positions shown · IV contrast (Omni 300)
Comparison: 12/12/2019

CLINICAL DATA: Intracranial hemorrhage

EXAM:
CT ANGIOGRAPHY HEAD
TECHNIQUE: Multidetector CT imaging of the head was performed using the
standard protocol during bolus administration of intravenous
contrast. Multiplanar CT image reconstructions and MIPs were
obtained to evaluate the vascular anatomy.
CONTRAST:  75mL OMNIPAQUE IOHEXOL 350 MG/ML SOLN

[Series 5: cow 2.0 · axial · 0.50mm/px · z∈[+1304,+1440]mm · 7 of 92 slices shown]
[im 12/92  brain]
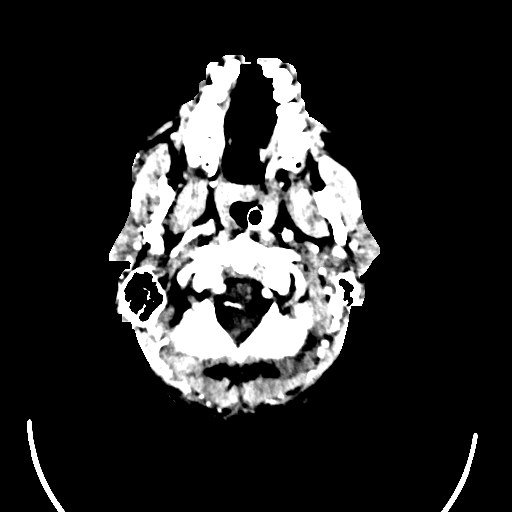
[im 23/92  bone]
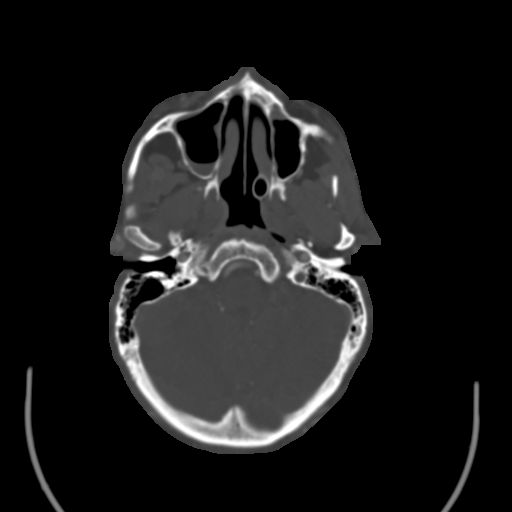
[im 35/92  brain]
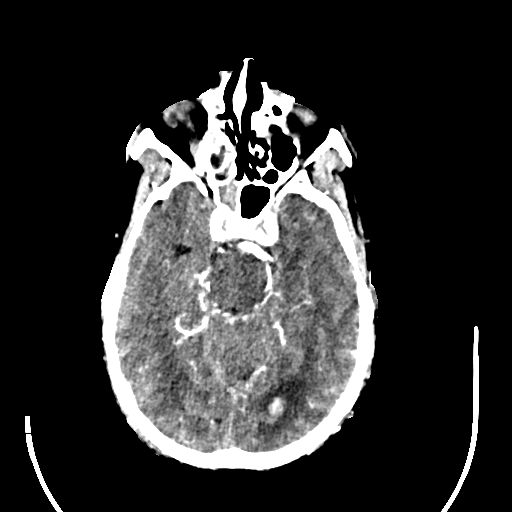
[im 46/92  bone]
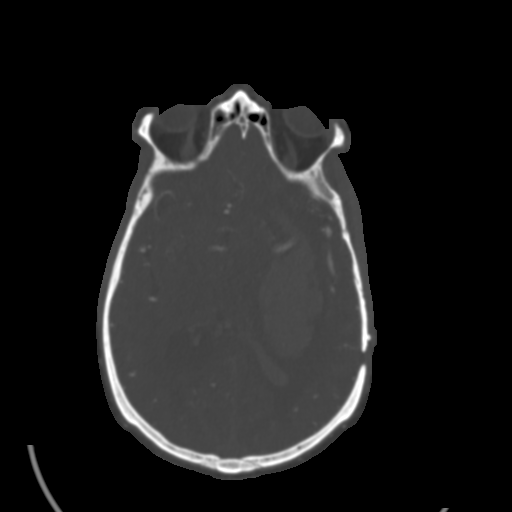
[im 57/92  brain]
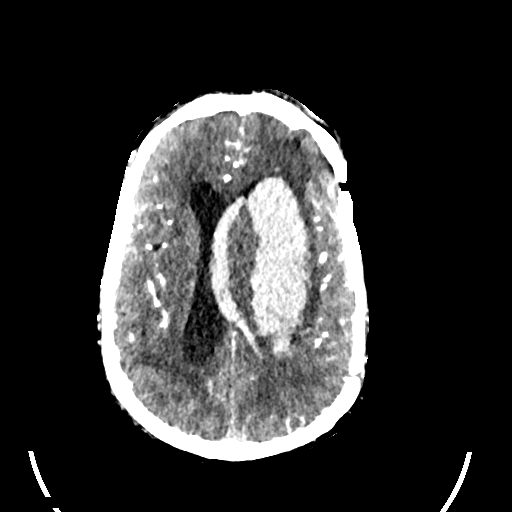
[im 69/92  bone]
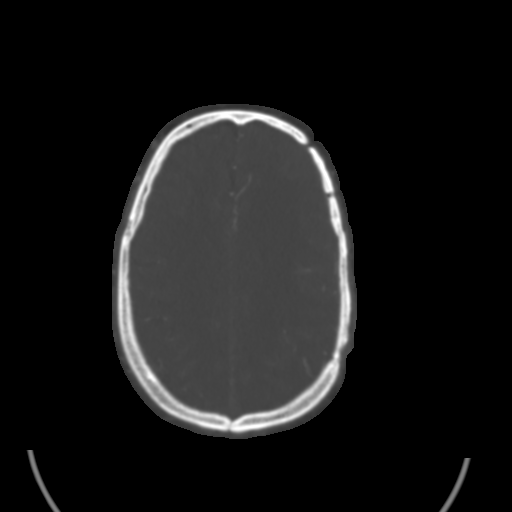
[im 80/92  brain]
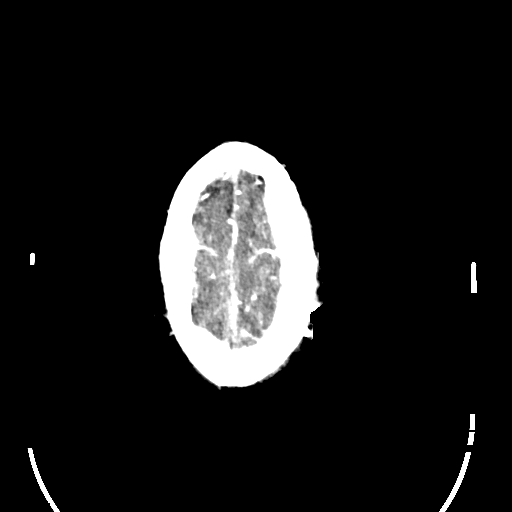

[7 of 47 positions shown; findings below may reference images not displayed]

FINDINGS: CTA HEAD

Anterior circulation: Intracranial internal carotid arteries are
patent with mild calcified plaque. Anterior and middle cerebral
arteries are patent. There is multifocal irregularity with greatest
involvement of left MCA and bilateral distal ACA branches with areas
of stenosis ranging from mild to marked. There is no abnormal
enhancement or vascularity in the region of parenchymal hemorrhage.

Posterior circulation: Intracranial vertebral arteries, basilar
artery, and posterior cerebral arteries are patent. There is
high-grade stenosis near the left P1/P2 PCA junction, which was
present on prior MRA. Irregularity of the right P2 and P3 PCA.

Venous sinuses: Not well evaluated.
IMPRESSION: No abnormal enhancement or vascularity in the region of parenchymal
hemorrhage.

Multifocal vessel irregularity primarily of medium and small
vessels. Left P1-P2 PCA junction high-grade stenosis was present on
12/12/2019 MRA. Additional areas of irregularity and stenosis in
anterior and posterior circulations may be progressed since the MRA
raising the possibility of a non-atherosclerotic vasculopathy.
